# Patient Record
Sex: Male | Born: 1985
Health system: Southern US, Community
[De-identification: ages and names within clinical notes are randomized; demographics above are authoritative.]

---
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
## 2008-11-14 ENCOUNTER — Emergency Department (HOSPITAL_BASED_OUTPATIENT_CLINIC_OR_DEPARTMENT_OTHER): Admission: EM | Admit: 2008-11-14 | Discharge: 2008-11-14 | Payer: Self-pay | Admitting: Emergency Medicine

## 2008-11-14 ENCOUNTER — Ambulatory Visit: Payer: Self-pay | Admitting: Diagnostic Radiology

## 2009-05-14 ENCOUNTER — Emergency Department (HOSPITAL_BASED_OUTPATIENT_CLINIC_OR_DEPARTMENT_OTHER): Admission: EM | Admit: 2009-05-14 | Discharge: 2009-05-14 | Payer: Self-pay | Admitting: Emergency Medicine

## 2009-11-06 ENCOUNTER — Emergency Department (HOSPITAL_BASED_OUTPATIENT_CLINIC_OR_DEPARTMENT_OTHER): Admission: EM | Admit: 2009-11-06 | Discharge: 2009-11-06 | Payer: Self-pay | Admitting: Emergency Medicine

## 2010-01-02 ENCOUNTER — Emergency Department (HOSPITAL_BASED_OUTPATIENT_CLINIC_OR_DEPARTMENT_OTHER): Admission: EM | Admit: 2010-01-02 | Discharge: 2010-01-03 | Payer: Self-pay | Admitting: Emergency Medicine

## 2010-01-09 ENCOUNTER — Emergency Department (HOSPITAL_BASED_OUTPATIENT_CLINIC_OR_DEPARTMENT_OTHER): Admission: EM | Admit: 2010-01-09 | Discharge: 2009-11-04 | Payer: Self-pay | Admitting: Emergency Medicine

## 2010-04-02 ENCOUNTER — Emergency Department (HOSPITAL_BASED_OUTPATIENT_CLINIC_OR_DEPARTMENT_OTHER)
Admission: EM | Admit: 2010-04-02 | Discharge: 2010-04-02 | Disposition: A | Discharge status: Home / Self Care | Payer: BC Managed Care – PPO | Attending: Emergency Medicine | Admitting: Emergency Medicine

## 2010-04-02 DIAGNOSIS — R51 Headache: Secondary | ICD-10-CM | POA: Insufficient documentation

## 2010-04-17 LAB — URINALYSIS, ROUTINE W REFLEX MICROSCOPIC
Glucose, UA: NEGATIVE mg/dL
Nitrite: NEGATIVE
Protein, ur: NEGATIVE mg/dL
Specific Gravity, Urine: 1.031 — ABNORMAL HIGH (ref 1.005–1.030)

## 2010-05-08 LAB — URINALYSIS, ROUTINE W REFLEX MICROSCOPIC
Nitrite: NEGATIVE
Specific Gravity, Urine: 1.037 — ABNORMAL HIGH (ref 1.005–1.030)
Urobilinogen, UA: 1 mg/dL (ref 0.0–1.0)

## 2010-05-08 LAB — GC/CHLAMYDIA PROBE AMP, GENITAL
Chlamydia, DNA Probe: NEGATIVE
GC Probe Amp, Genital: NEGATIVE

## 2010-05-08 LAB — URINE MICROSCOPIC-ADD ON

## 2010-05-18 ENCOUNTER — Emergency Department (HOSPITAL_COMMUNITY)
Admission: EM | Admit: 2010-05-18 | Discharge: 2010-05-18 | Disposition: A | Discharge status: Home / Self Care | Payer: BC Managed Care – PPO | Attending: Emergency Medicine | Admitting: Emergency Medicine

## 2010-05-18 DIAGNOSIS — K089 Disorder of teeth and supporting structures, unspecified: Secondary | ICD-10-CM | POA: Insufficient documentation

## 2010-05-18 DIAGNOSIS — K047 Periapical abscess without sinus: Secondary | ICD-10-CM | POA: Insufficient documentation

## 2010-09-11 ENCOUNTER — Emergency Department (HOSPITAL_BASED_OUTPATIENT_CLINIC_OR_DEPARTMENT_OTHER)
Admission: EM | Admit: 2010-09-11 | Discharge: 2010-09-11 | Disposition: A | Discharge status: Home / Self Care | Payer: BC Managed Care – PPO | Attending: Emergency Medicine | Admitting: Emergency Medicine

## 2010-09-11 ENCOUNTER — Emergency Department (INDEPENDENT_AMBULATORY_CARE_PROVIDER_SITE_OTHER): Payer: BC Managed Care – PPO

## 2010-09-11 DIAGNOSIS — X838XXA Intentional self-harm by other specified means, initial encounter: Secondary | ICD-10-CM | POA: Insufficient documentation

## 2010-09-11 DIAGNOSIS — X58XXXA Exposure to other specified factors, initial encounter: Secondary | ICD-10-CM

## 2010-09-11 DIAGNOSIS — S60229A Contusion of unspecified hand, initial encounter: Secondary | ICD-10-CM

## 2010-09-11 DIAGNOSIS — M79609 Pain in unspecified limb: Secondary | ICD-10-CM

## 2010-09-11 NOTE — ED Notes (Signed)
Punched a wall monday-pain to right hand

## 2010-09-11 NOTE — ED Provider Notes (Signed)
History     CSN: [id number] Arrival date & time: 09/11/2010  8:22 PM  Chief Complaint  Patient presents with  . Hand Injury   HPI Comments: Pt states that he punched something  Patient is a 25 y.o. male presenting with hand injury. The history is provided by the patient. No language interpreter was used.  Hand Injury  The incident occurred more than 2 days ago. The incident occurred at home. The injury mechanism was a direct blow. The pain is present in the right hand. The quality of the pain is described as aching. The pain is at a severity of 6/10. The pain is moderate. The pain has been constant since the incident. He reports no foreign bodies present. The symptoms are aggravated by movement and palpation. He has tried nothing for the symptoms.    History reviewed. No pertinent past medical history.  History reviewed. No pertinent past surgical history.  No family history on file.  History  Substance Use Topics  . Smoking status: Never Smoker   . Smokeless tobacco: Not on file  . Alcohol Use: Yes      Review of Systems  Constitutional: Negative.   Respiratory: Negative.   Cardiovascular: Negative.   Neurological: Negative.     Physical Exam  BP 134/67  Pulse 80  Temp(Src) 98.1 F (36.7 C) (Oral)  Resp 20  Ht 6\' 2"  (1.88 m)  Wt 222 lb (100.699 kg)  BMI 28.50 kg/m2  SpO2 99%  Physical Exam  Nursing note and vitals reviewed. Constitutional: He is oriented to person, place, and time. He appears well-developed and well-nourished.  Cardiovascular: Normal rate and regular rhythm.   Pulmonary/Chest: Effort normal.  Musculoskeletal: Normal range of motion. He exhibits tenderness.  Neurological: He is alert and oriented to person, place, and time.       No obvious swelling noted to the right hand:pt tender to palpation on the 4th and 5th metacarpal  Skin: Skin is warm and dry.    ED Course  Procedures Results for orders placed during the hospital encounter of  01/09/10  URINALYSIS, ROUTINE W REFLEX MICROSCOPIC      Component Value Range   Color, Urine YELLOW  YELLOW    Appearance CLEAR  CLEAR    Specific Gravity, Urine 1.031 (*) 1.005 - 1.030    pH 6.0  5.0 - 8.0    Glucose, UA NEGATIVE  NEGATIVE (mg/dL)   Hgb urine dipstick NEGATIVE  NEGATIVE    Bilirubin Urine NEGATIVE  NEGATIVE    Ketones, ur NEGATIVE  NEGATIVE (mg/dL)   Protein, ur NEGATIVE  NEGATIVE (mg/dL)   Urobilinogen, UA 1.0  0.0 - 1.0 (mg/dL)   Nitrite NEGATIVE  NEGATIVE    Leukocytes, UA    NEGATIVE    Value: NEGATIVE MICROSCOPIC NOT DONE ON URINES WITH NEGATIVE PROTEIN, BLOOD, LEUKOCYTES, NITRITE, OR GLUCOSE <1000 mg/dL.   Dg Hand Complete Right  09/11/2010  *RADIOLOGY REPORT*  Clinical Data: Injured right hand.  RIGHT HAND - COMPLETE 3+ VIEW  Comparison: None  Findings: The joint spaces are maintained.  No acute fracture.  IMPRESSION: No acute bony findings.  Original Report Authenticated By: P. Loralie Champagne, M.D.    MDM No acute finding noted on x-ray      Teressa Lower, NP 09/11/10 2132

## 2010-09-12 NOTE — ED Provider Notes (Signed)
Medical screening examination/treatment/procedure(s) were performed by non-physician practitioner and as supervising physician I was immediately available for consultation/collaboration.   Hanley Seamen, MD 09/12/10 [phone]

## 2010-09-12 NOTE — ED Provider Notes (Deleted)
History     CSN: [id number] Arrival date & time: 09/11/2010  8:22 PM  Chief Complaint  Patient presents with  . Hand Injury   HPI  History reviewed. No pertinent past medical history.  History reviewed. No pertinent past surgical history.  No family history on file.  History  Substance Use Topics  . Smoking status: Never Smoker   . Smokeless tobacco: Not on file  . Alcohol Use: Yes      Review of Systems  Physical Exam  BP 134/67  Pulse 80  Temp(Src) 98.1 F (36.7 C) (Oral)  Resp 20  Ht 6\' 2"  (1.88 m)  Wt 222 lb (100.699 kg)  BMI 28.50 kg/m2  SpO2 99%  Physical Exam  ED Course  Procedures  MDM       Hanley Seamen, MD 09/12/10 0640  Hanley Seamen, MD 09/12/10 [phone]

## 2010-12-30 ENCOUNTER — Emergency Department (INDEPENDENT_AMBULATORY_CARE_PROVIDER_SITE_OTHER): Payer: Self-pay

## 2010-12-30 ENCOUNTER — Encounter (HOSPITAL_BASED_OUTPATIENT_CLINIC_OR_DEPARTMENT_OTHER): Payer: Self-pay | Admitting: *Deleted

## 2010-12-30 ENCOUNTER — Emergency Department (HOSPITAL_BASED_OUTPATIENT_CLINIC_OR_DEPARTMENT_OTHER)
Admission: EM | Admit: 2010-12-30 | Discharge: 2010-12-30 | Disposition: A | Discharge status: Home / Self Care | Payer: Self-pay | Attending: Emergency Medicine | Admitting: Emergency Medicine

## 2010-12-30 DIAGNOSIS — R509 Fever, unspecified: Secondary | ICD-10-CM

## 2010-12-30 DIAGNOSIS — J3489 Other specified disorders of nose and nasal sinuses: Secondary | ICD-10-CM | POA: Insufficient documentation

## 2010-12-30 DIAGNOSIS — R05 Cough: Secondary | ICD-10-CM

## 2010-12-30 DIAGNOSIS — J40 Bronchitis, not specified as acute or chronic: Secondary | ICD-10-CM | POA: Insufficient documentation

## 2010-12-30 MED ORDER — AZITHROMYCIN 250 MG PO TABS: 250.0000 mg | ORAL_TABLET | Freq: Every day | ORAL | Status: AC

## 2010-12-30 NOTE — ED Notes (Signed)
Pt amb to triage with quick steady gait. Pt reports cough and congestion x 2 days with one episode of emesis today.

## 2010-12-30 NOTE — ED Provider Notes (Signed)
History     CSN: [id number] Arrival date & time: 12/30/2010  5:10 PM   First MD Initiated Contact with Patient 12/30/10 1735      Chief Complaint  Patient presents with  . Cough  . Nasal Congestion    (Consider location/radiation/quality/duration/timing/severity/associated sxs/prior treatment) Patient is a 25 y.o. male presenting with cough. The history is provided by the patient. No language interpreter was used.  Cough This is a new problem. The current episode started 2 days ago. The problem occurs constantly. The problem has not changed since onset.The cough is productive of sputum. There has been no fever. Pertinent negatives include no headaches. He has tried decongestants and an opioid for the symptoms. The treatment provided no relief. He is not a smoker.    History reviewed. No pertinent past medical history.  History reviewed. No pertinent past surgical history.  History reviewed. No pertinent family history.  History  Substance Use Topics  . Smoking status: Never Smoker   . Smokeless tobacco: Not on file  . Alcohol Use: Yes      Review of Systems  Respiratory: Positive for cough.   Neurological: Negative for headaches.  All other systems reviewed and are negative.    Allergies  Review of patient's allergies indicates no known allergies.  Home Medications   Current Outpatient Rx  Name Route Sig Dispense Refill  . AMOXICILLIN PO Oral Take 1 capsule by mouth daily.      . GUAIFENESIN 600 MG PO TB12 Oral Take 600 mg by mouth 2 (two) times daily.      . DAYQUIL/NYQUIL COLD/FLU RELIEF PO Oral Take 2 capsules by mouth every 6 (six) hours as needed. Congestion      . TRAMADOL HCL 50 MG PO TABS Oral Take 50 mg by mouth every 6 (six) hours as needed. Maximum dose= 8 tablets per day       BP 113/62  Pulse 65  Temp(Src) 97.6 F (36.4 C) (Oral)  Resp 18  SpO2 100%  Physical Exam  Nursing note and vitals reviewed. Constitutional: He is oriented to  person, place, and time. He appears well-developed and well-nourished.  HENT:  Head: Normocephalic and atraumatic.  Right Ear: External ear normal.  Left Ear: External ear normal.  Mouth/Throat: Posterior oropharyngeal erythema present.  Cardiovascular: Normal rate and regular rhythm.   Pulmonary/Chest: Effort normal and breath sounds normal.  Musculoskeletal: Normal range of motion.  Neurological: He is alert and oriented to person, place, and time.  Skin: Skin is warm and dry.  Psychiatric: He has a normal mood and affect.    ED Course  Procedures (including critical care time)  Labs Reviewed - No data to display Dg Chest 2 View  12/30/2010  *RADIOLOGY REPORT*  Clinical Data: Cough and fever.  CHEST - 2 VIEW  Comparison: None  Findings: The cardiac silhouette, mediastinal and hilar contours are within normal limits.  Minimal streaky areas of atelectasis but no infiltrates or effusions.  The bony thorax is intact.  IMPRESSION: Streaky bibasilar atelectasis but no definite infiltrates.  Original Report Authenticated By: P. Loralie Champagne, M.D.     1. Bronchitis       MDM  Pt in no acute distress        Teressa Lower, NP 12/30/10 1834

## 2010-12-31 NOTE — ED Provider Notes (Signed)
Medical screening examination/treatment/procedure(s) were performed by non-physician practitioner and as supervising physician I was immediately available for consultation/collaboration.   Glynn Octave, MD 12/31/10 [phone]

## 2011-01-01 ENCOUNTER — Encounter (HOSPITAL_BASED_OUTPATIENT_CLINIC_OR_DEPARTMENT_OTHER): Payer: Self-pay

## 2011-01-01 ENCOUNTER — Emergency Department (HOSPITAL_BASED_OUTPATIENT_CLINIC_OR_DEPARTMENT_OTHER)
Admission: EM | Admit: 2011-01-01 | Discharge: 2011-01-01 | Disposition: A | Discharge status: Home / Self Care | Payer: Self-pay | Attending: Emergency Medicine | Admitting: Emergency Medicine

## 2011-01-01 DIAGNOSIS — B9789 Other viral agents as the cause of diseases classified elsewhere: Secondary | ICD-10-CM | POA: Insufficient documentation

## 2011-01-01 DIAGNOSIS — B349 Viral infection, unspecified: Secondary | ICD-10-CM

## 2011-01-01 DIAGNOSIS — R1110 Vomiting, unspecified: Secondary | ICD-10-CM | POA: Insufficient documentation

## 2011-01-01 MED ORDER — ONDANSETRON HCL 8 MG PO TABS: 8.0000 mg | ORAL_TABLET | Freq: Three times a day (TID) | ORAL | Status: AC | PRN

## 2011-01-01 MED ORDER — CETIRIZINE-PSEUDOEPHEDRINE ER 5-120 MG PO TB12: 1.0000 | ORAL_TABLET | Freq: Every day | ORAL | Status: AC

## 2011-01-01 MED ORDER — ONDANSETRON 8 MG PO TBDP
8.0000 mg | ORAL_TABLET | Freq: Once | ORAL | Status: AC
  Administered 2011-01-01: 8 mg via ORAL
  Filled 2011-01-01: qty 1

## 2011-01-01 NOTE — ED Provider Notes (Signed)
History     CSN: [id number] Arrival date & time: 01/01/2011  3:27 PM   First MD Initiated Contact with Patient 01/01/11 1545      Chief Complaint  Patient presents with  . Emesis    (Consider location/radiation/quality/duration/timing/severity/associated sxs/prior treatment) Patient is a 25 y.o. male presenting with vomiting.  Emesis  Pertinent negatives include no abdominal pain, no chills, no diarrhea, no fever and no headaches.  pt notes recent nasal congestion, post nasal drip, non prod cough, subj fever. Was seen in ed and dx w bronchitis. On zithromax and amox. States today had episode emesis, not bloody or bilious. Says looked like mucous. Has been having quit a lot of post nasal drainage, swallowing secretions. No headache. No sinus pain. No neck pain or stiffness. No sob. Denies any worsening of cough, says seems to be improving. No known ill contacts. No abd pain. No diarrhea. Normal appetite.   History reviewed. No pertinent past medical history.  History reviewed. No pertinent past surgical history.  No family history on file.  History  Substance Use Topics  . Smoking status: Never Smoker   . Smokeless tobacco: Not on file  . Alcohol Use: Yes      Review of Systems  Constitutional: Negative for fever and chills.  HENT: Negative for sore throat, neck pain and neck stiffness.   Eyes: Negative for discharge and redness.  Respiratory: Negative for shortness of breath.   Cardiovascular: Negative for chest pain and leg swelling.  Gastrointestinal: Positive for vomiting. Negative for abdominal pain and diarrhea.  Genitourinary: Negative for flank pain.  Musculoskeletal: Negative for back pain.  Skin: Negative for rash.  Neurological: Negative for headaches.  Hematological: Does not bruise/bleed easily.  Psychiatric/Behavioral: Negative for confusion.    Allergies  Review of patient's allergies indicates no known allergies.  Home Medications   Current  Outpatient Rx  Name Route Sig Dispense Refill  . ACETAMINOPHEN 500 MG PO TABS Oral Take 1,000 mg by mouth 3 (three) times daily as needed. For pain     . AMOXICILLIN PO Oral Take 1 capsule by mouth daily.      Marland Kitchen OVER THE COUNTER MEDICATION Oral Take 1 tablet by mouth 2 (two) times daily. mucinex cold formula     . AZITHROMYCIN 250 MG PO TABS Oral Take 1 tablet (250 mg total) by mouth daily. Take first 2 tablets together, then 1 every day until finished. 6 tablet 0    BP 116/72  Pulse 70  Temp(Src) 98.2 F (36.8 C) (Oral)  Resp 16  Ht 6\' 2"  (1.88 m)  Wt 215 lb (97.523 kg)  BMI 27.60 kg/m2  SpO2 98%  Physical Exam  Nursing note and vitals reviewed. Constitutional: He is oriented to person, place, and time. He appears well-developed and well-nourished. No distress.  HENT:  Head: Atraumatic.  Mouth/Throat: Oropharynx is clear and moist.       Nasal congestion. No sinus tenderness  Eyes: Pupils are equal, round, and reactive to light.  Neck: Neck supple. No tracheal deviation present.  Cardiovascular: Normal rate, regular rhythm, normal heart sounds and intact distal pulses.  Exam reveals no gallop and no friction rub.   No murmur heard. Pulmonary/Chest: Effort normal. No accessory muscle usage. No respiratory distress. He has no rales.  Abdominal: Soft. Bowel sounds are normal. He exhibits no distension and no mass. There is no tenderness.  Musculoskeletal: Normal range of motion. He exhibits no edema and no tenderness.  Neurological: He is alert  and oriented to person, place, and time.  Skin: Skin is warm and dry.  Psychiatric: He has a normal mood and affect.    ED Course  Procedures (including critical care time)  Labs Reviewed - No data to display Dg Chest 2 View  12/30/2010  *RADIOLOGY REPORT*  Clinical Data: Cough and fever.  CHEST - 2 VIEW  Comparison: None  Findings: The cardiac silhouette, mediastinal and hilar contours are within normal limits.  Minimal streaky areas  of atelectasis but no infiltrates or effusions.  The bony thorax is intact.  IMPRESSION: Streaky bibasilar atelectasis but no definite infiltrates.  Original Report Authenticated By: P. Loralie Champagne, M.D.     No diagnosis found.    MDM  cxr 2 days ago neg for pna. zofran po. Po fluids. Suspect viral illness.        Suzi Roots, MD 01/01/11 [phone]

## 2011-01-01 NOTE — ED Notes (Signed)
Reports dx with bronchitis 11/27-states he is now vomiting-went back to work today but states had to leave

## 2011-05-04 ENCOUNTER — Emergency Department (HOSPITAL_BASED_OUTPATIENT_CLINIC_OR_DEPARTMENT_OTHER)
Admission: EM | Admit: 2011-05-04 | Discharge: 2011-05-04 | Disposition: A | Discharge status: Home / Self Care | Payer: BC Managed Care – PPO | Attending: Emergency Medicine | Admitting: Emergency Medicine

## 2011-05-04 ENCOUNTER — Encounter (HOSPITAL_BASED_OUTPATIENT_CLINIC_OR_DEPARTMENT_OTHER): Payer: Self-pay | Admitting: *Deleted

## 2011-05-04 DIAGNOSIS — S025XXA Fracture of tooth (traumatic), initial encounter for closed fracture: Secondary | ICD-10-CM | POA: Insufficient documentation

## 2011-05-04 DIAGNOSIS — K0889 Other specified disorders of teeth and supporting structures: Secondary | ICD-10-CM

## 2011-05-04 DIAGNOSIS — X58XXXA Exposure to other specified factors, initial encounter: Secondary | ICD-10-CM | POA: Insufficient documentation

## 2011-05-04 MED ORDER — AMOXICILLIN 500 MG PO CAPS: 500.0000 mg | ORAL_CAPSULE | Freq: Three times a day (TID) | ORAL | Status: AC

## 2011-05-04 MED ORDER — ONDANSETRON 4 MG PO TBDP: 4.0000 mg | ORAL_TABLET | Freq: Three times a day (TID) | ORAL | Status: AC | PRN

## 2011-05-04 NOTE — ED Provider Notes (Signed)
History     CSN: [id number]  Arrival date & time 05/04/11  1246   First MD Initiated Contact with Patient 05/04/11 1329      Chief Complaint  Patient presents with  . Dental Pain     HPI Right lower molar broken gum and face swelling has dentist appt Friday but cant get in until then thinks it may be abcessed  History reviewed. No pertinent past medical history.  History reviewed. No pertinent past surgical history.  History reviewed. No pertinent family history.  History  Substance Use Topics  . Smoking status: Never Smoker   . Smokeless tobacco: Not on file  . Alcohol Use: Yes      Review of Systems  All other systems reviewed and are negative.    Allergies  Review of patient's allergies indicates no known allergies.  Home Medications   Current Outpatient Rx  Name Route Sig Dispense Refill  . ACETAMINOPHEN 500 MG PO TABS Oral Take 1,000 mg by mouth 3 (three) times daily as needed. For pain     . AMOXICILLIN 500 MG PO CAPS Oral Take 1 capsule (500 mg total) by mouth 3 (three) times daily. 21 capsule 0  . AMOXICILLIN PO Oral Take 1 capsule by mouth daily.      Marland Kitchen CETIRIZINE-PSEUDOEPHEDRINE ER 5-120 MG PO TB12 Oral Take 1 tablet by mouth daily. 20 tablet 0  . ONDANSETRON 4 MG PO TBDP Oral Take 1 tablet (4 mg total) by mouth every 8 (eight) hours as needed for nausea. 15 tablet 0  . OVER THE COUNTER MEDICATION Oral Take 1 tablet by mouth 2 (two) times daily. mucinex cold formula       BP 135/62  Pulse 93  Temp(Src) 98.6 F (37 C) (Oral)  Resp 18  Ht 6\' 2"  (1.88 m)  Wt 212 lb 9 oz (96.418 kg)  BMI 27.29 kg/m2  SpO2 99%  Physical Exam  Nursing note and vitals reviewed. Constitutional: He is oriented to person, place, and time. He appears well-developed and well-nourished. No distress.  HENT:  Head: Normocephalic and atraumatic.  Mouth/Throat:    Eyes: Pupils are equal, round, and reactive to light.  Neck: Normal range of motion.  Cardiovascular:  Normal rate and intact distal pulses.   Pulmonary/Chest: No respiratory distress.  Abdominal: Normal appearance. He exhibits no distension.  Musculoskeletal: Normal range of motion.  Neurological: He is alert and oriented to person, place, and time. No cranial nerve deficit.  Skin: Skin is warm and dry. No rash noted.  Psychiatric: He has a normal mood and affect. His behavior is normal.    ED Course  Procedures (including critical care time)  Labs Reviewed - No data to display No results found.   1. Pain, dental       MDM          Nelia Shi, MD 05/04/11 [phone]

## 2011-05-04 NOTE — ED Notes (Signed)
Right lower molar broken gum and face swelling has dentist appt Friday but cant get in until then thinks it may be abcessed

## 2011-05-04 NOTE — Discharge Instructions (Signed)
Dental Pain  A tooth ache may be caused by cavities (tooth decay). Cavities expose the nerve of the tooth to air and hot or cold temperatures. It may come from an infection or abscess (also called a boil or furuncle) around your tooth. It is also often caused by dental caries (tooth decay). This causes the pain you are having.  DIAGNOSIS   Your caregiver can diagnose this problem by exam.  TREATMENT   · If caused by an infection, it may be treated with medications which kill germs (antibiotics) and pain medications as prescribed by your caregiver. Take medications as directed.  · Only take over-the-counter or prescription medicines for pain, discomfort, or fever as directed by your caregiver.  · Whether the tooth ache today is caused by infection or dental disease, you should see your dentist as soon as possible for further care.  SEEK MEDICAL CARE IF:  The exam and treatment you received today has been provided on an emergency basis only. This is not a substitute for complete medical or dental care. If your problem worsens or new problems (symptoms) appear, and you are unable to meet with your dentist, call or return to this location.  SEEK IMMEDIATE MEDICAL CARE IF:   · You have a fever.  · You develop redness and swelling of your face, jaw, or neck.  · You are unable to open your mouth.  · You have severe pain uncontrolled by pain medicine.  MAKE SURE YOU:   · Understand these instructions.  · Will watch your condition.  · Will get help right away if you are not doing well or get worse.  Document Released: 01/19/2005 Document Revised: 01/08/2011 Document Reviewed: 09/07/2007  [employer]® Patient Information ©2012 [employer].

## 2011-05-31 ENCOUNTER — Emergency Department (HOSPITAL_BASED_OUTPATIENT_CLINIC_OR_DEPARTMENT_OTHER)
Admission: EM | Admit: 2011-05-31 | Discharge: 2011-05-31 | Disposition: A | Discharge status: Home / Self Care | Payer: BC Managed Care – PPO | Attending: Emergency Medicine | Admitting: Emergency Medicine

## 2011-05-31 DIAGNOSIS — K029 Dental caries, unspecified: Secondary | ICD-10-CM

## 2011-05-31 MED ORDER — TRAMADOL HCL 50 MG PO TABS: 50.0000 mg | ORAL_TABLET | Freq: Four times a day (QID) | ORAL | Status: AC | PRN

## 2011-05-31 MED ORDER — PENICILLIN V POTASSIUM 500 MG PO TABS: 500.0000 mg | ORAL_TABLET | Freq: Four times a day (QID) | ORAL | Status: AC

## 2011-05-31 NOTE — Discharge Instructions (Signed)
Dental Care and Dentist Visits   Dental care supports good overall health. Regular dental visits can also help you avoid dental pain, bleeding, infection, and other more serious health problems in the future. It is important to keep the mouth healthy because diseases in the teeth, gums, and other oral tissues can spread to other areas of the body. Some problems, such as diabetes, heart disease, and pre-term labor have been associated with poor oral health.   See your dentist every 6 months. If you experience emergency problems such as a toothache or broken tooth, go to the dentist right away. If you see your dentist regularly, you may catch problems early. It is easier to be treated for problems in the early stages.   WHAT TO EXPECT AT A DENTIST VISIT   Your dentist will look for many common oral health problems and recommend proper treatment. At your regular dental visit, you can expect:   Gentle cleaning of the teeth and gums. This includes scraping and polishing. This helps to remove the sticky substance around the teeth and gums (plaque). Plaque forms in the mouth shortly after eating. Over time, plaque hardens on the teeth as tartar. If tartar is not removed regularly, it can cause problems. Cleaning also helps remove stains.   Periodic X-rays. These pictures of the teeth and supporting bone will help your dentist assess the health of your teeth.   Periodic fluoride treatments. Fluoride is a natural mineral shown to help strengthen teeth. Fluoride treatment involves applying a fluoride gel or varnish to the teeth. It is most commonly done in children.   Examination of the mouth, tongue, jaws, teeth, and gums to look for any oral health problems, such as:   Cavities (dental caries). This is decay on the tooth caused by plaque, sugar, and acid in the mouth. It is best to catch a cavity when it is small.   Inflammation of the gums caused by plaque buildup (gingivitis).   Problems with the mouth or malformed or  misaligned teeth.   Oral cancer or other diseases of the soft tissues or jaws.   KEEP YOUR TEETH AND GUMS HEALTHY   For healthy teeth and gums, follow these general guidelines as well as your dentist's specific advice:   Have your teeth professionally cleaned at the dentist every 6 months.   Brush twice daily with a fluoride toothpaste.   Floss your teeth daily.   Ask your dentist if you need fluoride supplements, treatments, or fluoride toothpaste.   Eat a healthy diet. Reduce foods and drinks with added sugar.   Avoid smoking.  TREATMENT FOR ORAL HEALTH PROBLEMS   If you have oral health problems, treatment varies depending on the conditions present in your teeth and gums.   Your caregiver will most likely recommend good oral hygiene at each visit.   For cavities, gingivitis, or other oral health disease, your caregiver will perform a procedure to treat the problem. This is typically done at a separate appointment. Sometimes your caregiver will refer you to another dental specialist for specific tooth problems or for surgery.  SEEK IMMEDIATE DENTAL CARE IF:   You have pain, bleeding, or soreness in the gum, tooth, jaw, or mouth area.   A permanent tooth becomes loose or separated from the gum socket.   You experience a blow or injury to the mouth or jaw area.  Document Released: 10/01/2010 Document Revised: 01/08/2011 Document Reviewed: 10/01/2010   [employer] Patient Information 2012 [employer].

## 2011-05-31 NOTE — ED Provider Notes (Signed)
History     CSN: [id number]  Arrival date & time 05/31/11  0103   First MD Initiated Contact with Patient 05/31/11 0109      Chief Complaint  Patient presents with  . Dental Pain    (Consider location/radiation/quality/duration/timing/severity/associated sxs/prior treatment) Patient is a 26 y.o. male presenting with tooth pain. The history is provided by the patient. No language interpreter was used.  Dental PainPrimary symptoms do not include mouth pain, oral bleeding or oral lesions. The symptoms began more than 1 month ago. The symptoms are worsening. The symptoms are recurrent. The symptoms occur constantly.  Additional symptoms include: dental sensitivity to temperature. Additional symptoms do not include: purulent gums. Medical issues do not include: smoking.  Wants a second opinion does not want tooth removed.  No past medical history on file.  No past surgical history on file.  No family history on file.  History  Substance Use Topics  . Smoking status: Never Smoker   . Smokeless tobacco: Not on file  . Alcohol Use: Yes      Review of Systems  All other systems reviewed and are negative.    Allergies  Review of patient's allergies indicates no known allergies.  Home Medications   Current Outpatient Rx  Name Route Sig Dispense Refill  . ACETAMINOPHEN 500 MG PO TABS Oral Take 1,000 mg by mouth 3 (three) times daily as needed. For pain     . AMOXICILLIN PO Oral Take 1 capsule by mouth daily.      Marland Kitchen CETIRIZINE-PSEUDOEPHEDRINE ER 5-120 MG PO TB12 Oral Take 1 tablet by mouth daily. 20 tablet 0  . OVER THE COUNTER MEDICATION Oral Take 1 tablet by mouth 2 (two) times daily. mucinex cold formula     . PENICILLIN V POTASSIUM 500 MG PO TABS Oral Take 1 tablet (500 mg total) by mouth 4 (four) times daily. 28 tablet 0  . TRAMADOL HCL 50 MG PO TABS Oral Take 1 tablet (50 mg total) by mouth every 6 (six) hours as needed for pain. 15 tablet 0    BP 128/79  Pulse 58   Temp(Src) 98.2 F (36.8 C) (Oral)  Resp 18  SpO2 100%  Physical Exam  Constitutional: He appears well-developed and well-nourished. No distress.  HENT:  Head: Normocephalic and atraumatic.  Mouth/Throat: Oropharynx is clear and moist.    Eyes: EOM are normal.  Neck: Normal range of motion. Neck supple.  Cardiovascular: Normal rate and regular rhythm.   Pulmonary/Chest: Effort normal and breath sounds normal.  Neurological: He is alert. He has normal reflexes.  Psychiatric: He has a normal mood and affect.    ED Course  Procedures (including critical care time)  Labs Reviewed - No data to display No results found.   1. Dental caries extending into pulp       MDM  Follow up with a dentist take all antibiotics        Syna Gad K Foster Sonnier-Rasch, MD 05/31/11 [phone]

## 2011-05-31 NOTE — ED Notes (Signed)
Pt states that he was seen by his dentist and told that his tooth needed extraction states that while waiting for a second opinion he began to have pain and swelling again

## 2012-04-15 ENCOUNTER — Encounter (HOSPITAL_BASED_OUTPATIENT_CLINIC_OR_DEPARTMENT_OTHER): Payer: Self-pay

## 2012-04-15 ENCOUNTER — Emergency Department (HOSPITAL_BASED_OUTPATIENT_CLINIC_OR_DEPARTMENT_OTHER)
Admission: EM | Admit: 2012-04-15 | Discharge: 2012-04-15 | Disposition: A | Discharge status: Home / Self Care | Payer: BC Managed Care – PPO | Attending: Emergency Medicine | Admitting: Emergency Medicine

## 2012-04-15 DIAGNOSIS — K5289 Other specified noninfective gastroenteritis and colitis: Secondary | ICD-10-CM | POA: Insufficient documentation

## 2012-04-15 DIAGNOSIS — R197 Diarrhea, unspecified: Secondary | ICD-10-CM | POA: Insufficient documentation

## 2012-04-15 DIAGNOSIS — R109 Unspecified abdominal pain: Secondary | ICD-10-CM | POA: Insufficient documentation

## 2012-04-15 MED ORDER — ONDANSETRON HCL 8 MG PO TABS: 8.0000 mg | ORAL_TABLET | Freq: Three times a day (TID) | ORAL | Status: DC | PRN

## 2012-04-15 MED ORDER — SODIUM CHLORIDE 0.9 % IV BOLUS (SEPSIS)
1000.0000 mL | Freq: Once | INTRAVENOUS | Status: AC
  Administered 2012-04-15: 1000 mL via INTRAVENOUS

## 2012-04-15 MED ORDER — ONDANSETRON HCL 4 MG/2ML IJ SOLN
4.0000 mg | Freq: Once | INTRAMUSCULAR | Status: AC
  Administered 2012-04-15: 4 mg via INTRAVENOUS
  Filled 2012-04-15: qty 2

## 2012-04-15 MED ORDER — KETOROLAC TROMETHAMINE 30 MG/ML IJ SOLN
30.0000 mg | Freq: Once | INTRAMUSCULAR | Status: AC
  Administered 2012-04-15: 30 mg via INTRAVENOUS
  Filled 2012-04-15: qty 1

## 2012-04-15 NOTE — ED Notes (Signed)
Pt states that he is feeling better and is ready for discharge, MD notified.

## 2012-04-15 NOTE — ED Notes (Signed)
Pt reports nausea, vomiting, diarrhea, and abdominal pain that started last night.

## 2012-04-15 NOTE — ED Provider Notes (Signed)
History     CSN: [id number]  Arrival date & time 04/15/12  1012   First MD Initiated Contact with Patient 04/15/12 1032      Chief Complaint  Patient presents with  . Emesis  . Diarrhea  . Abdominal Pain    (Consider location/radiation/quality/duration/timing/severity/associated sxs/prior treatment) HPI Comments: Patient presents for eval of n/v/d for the past 12 hours.  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  All non-bloody, non-bilious.  No fever or chills.  Reports generalized cramping but no pain.  Patient is a 27 y.o. male presenting with vomiting. The history is provided by the patient.  Emesis Severity:  Moderate Duration:  12 hours Timing:  Constant Quality:  Stomach contents Able to tolerate:  Liquids Progression:  Worsening Chronicity:  New Recent urination:  Normal Relieved by:  Nothing Worsened by:  Nothing tried   History reviewed. No pertinent past medical history.  History reviewed. No pertinent past surgical history.  No family history on file.  History  Substance Use Topics  . Smoking status: Never Smoker   . Smokeless tobacco: Not on file  . Alcohol Use: Yes     Comment: weekends      Review of Systems  Gastrointestinal: Positive for vomiting.  All other systems reviewed and are negative.    Allergies  Review of patient's allergies indicates no known allergies.  Home Medications  No current outpatient prescriptions on file.  BP 128/72  Pulse 82  Temp(Src) 98.4 F (36.9 C) (Oral)  Resp 20  SpO2 100%  Physical Exam  Nursing note and vitals reviewed. Constitutional: He is oriented to person, place, and time. He appears well-developed and well-nourished. No distress.  HENT:  Head: Normocephalic and atraumatic.  Mouth/Throat: Oropharynx is clear and moist.  Neck: Normal range of motion. Neck supple.  Cardiovascular: Normal rate and regular rhythm.   No murmur  heard. Pulmonary/Chest: Effort normal and breath sounds normal. No respiratory distress. He has no wheezes.  Abdominal: Soft. Bowel sounds are normal. He exhibits no distension. There is no tenderness.  Musculoskeletal: Normal range of motion. He exhibits no edema.  Lymphadenopathy:    He has no cervical adenopathy.  Neurological: He is alert and oriented to person, place, and time.  Skin: Skin is warm and dry. He is not diaphoretic.    ED Course  Procedures (including critical care time)  Labs Reviewed - No data to display No results found.   No diagnosis found.    MDM  Symptoms likely viral in nature.  Feels better with meds given.  Will discharge with zofran, return prn.        Geoffery Lyons, MD 04/15/12 1135

## 2012-07-23 IMAGING — CR DG HAND COMPLETE 3+V*R*
3 series · 3 of 3 positions shown · non-contrast
Comparison: None

CLINICAL DATA: Injured right hand.

RIGHT HAND - COMPLETE 3+ VIEW

[x hand pa right]
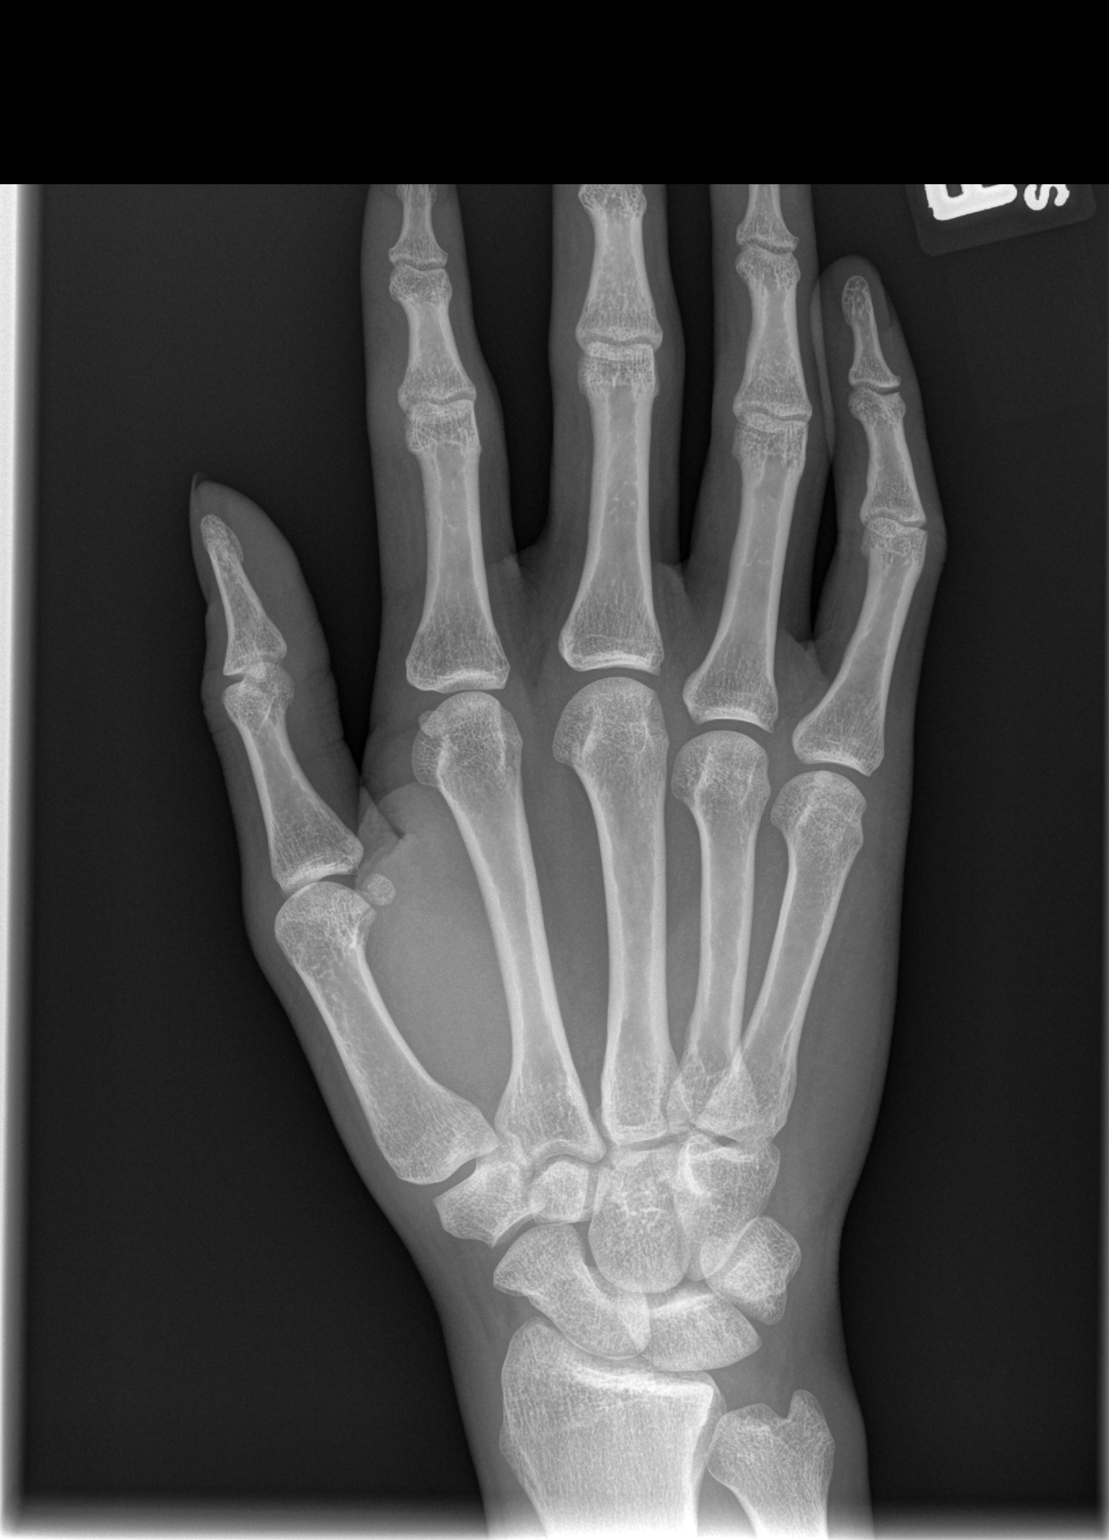

[x hand oblique right]
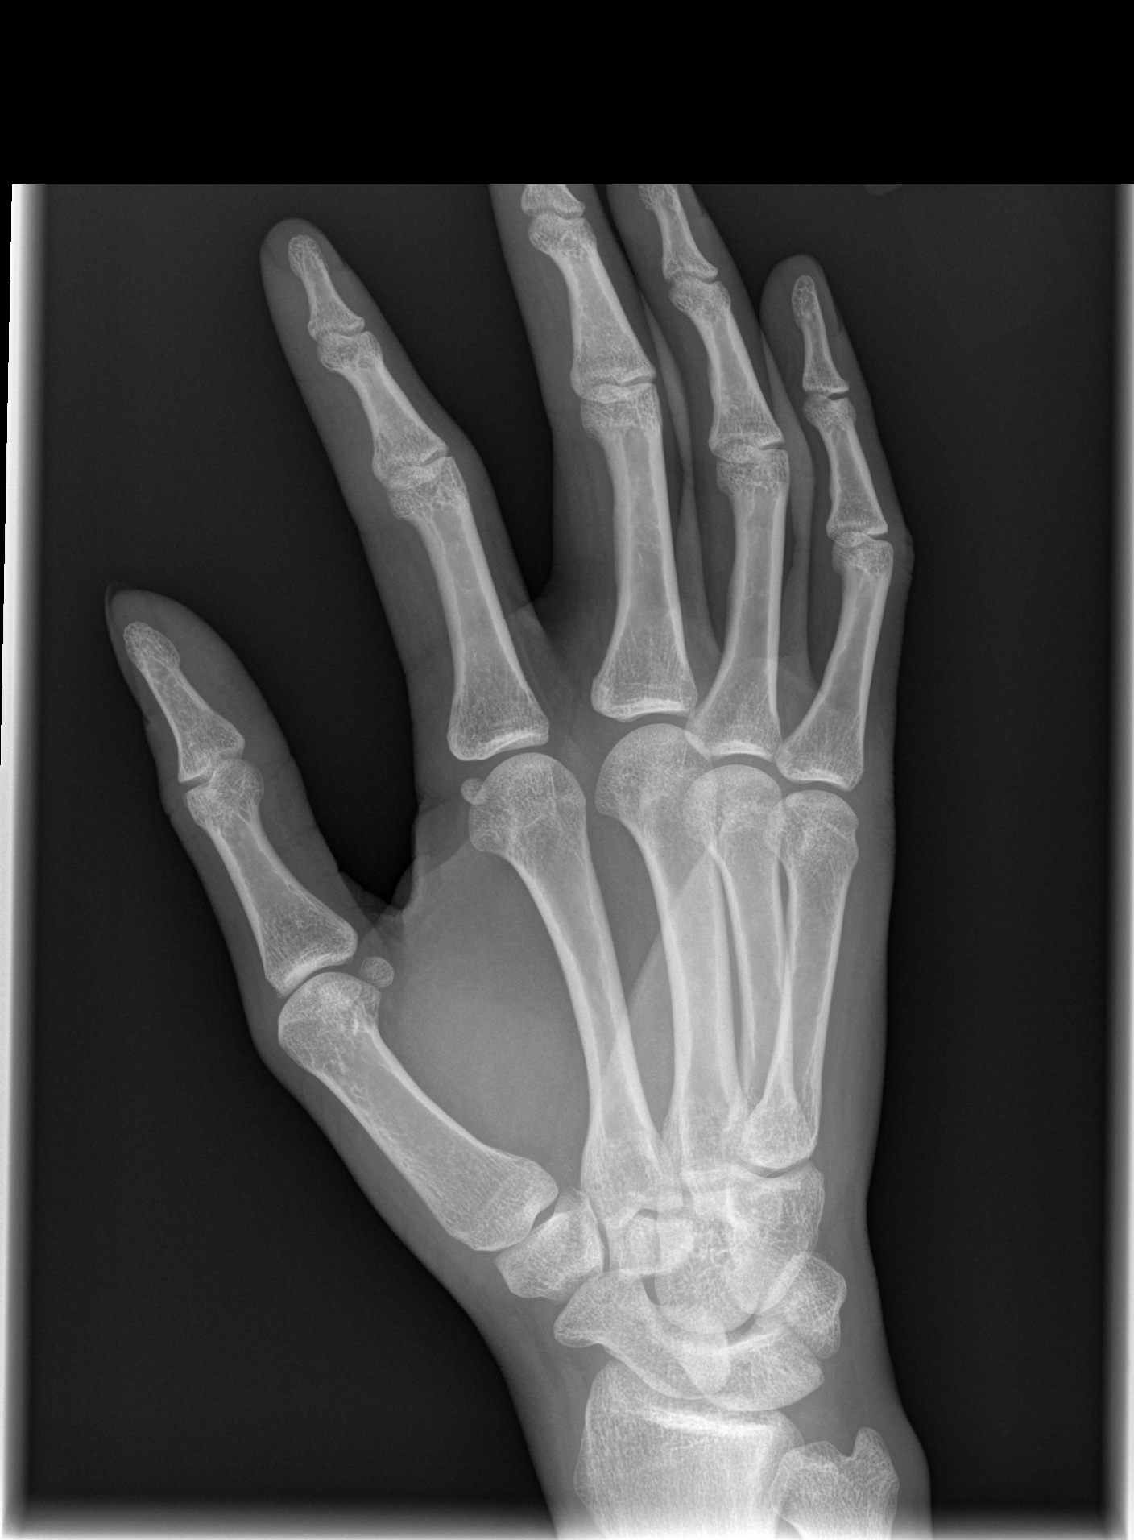

[x hand lat right]
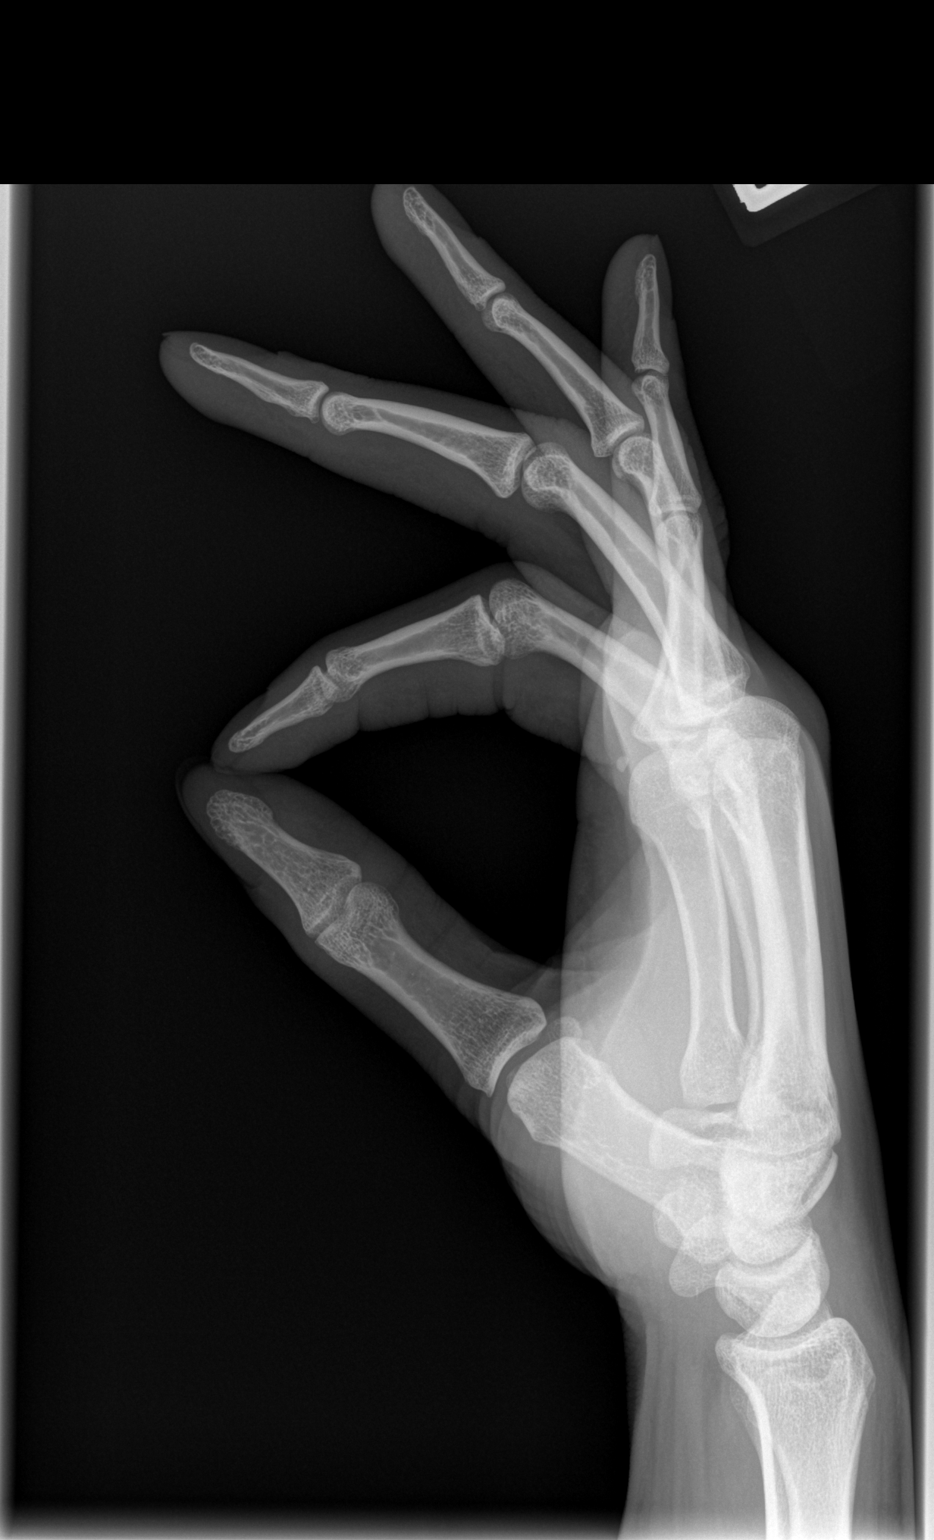

[3 of 3 positions shown; findings below may reference images not displayed]

FINDINGS: The joint spaces are maintained.  No acute fracture.
IMPRESSION: No acute bony findings.

## 2012-08-06 ENCOUNTER — Encounter (HOSPITAL_BASED_OUTPATIENT_CLINIC_OR_DEPARTMENT_OTHER): Payer: Self-pay | Admitting: *Deleted

## 2012-08-06 ENCOUNTER — Emergency Department (HOSPITAL_BASED_OUTPATIENT_CLINIC_OR_DEPARTMENT_OTHER)
Admission: EM | Admit: 2012-08-06 | Discharge: 2012-08-06 | Disposition: A | Discharge status: Home / Self Care | Payer: Self-pay | Attending: Emergency Medicine | Admitting: Emergency Medicine

## 2012-08-06 DIAGNOSIS — J029 Acute pharyngitis, unspecified: Secondary | ICD-10-CM | POA: Insufficient documentation

## 2012-08-06 DIAGNOSIS — R1310 Dysphagia, unspecified: Secondary | ICD-10-CM | POA: Insufficient documentation

## 2012-08-06 DIAGNOSIS — R5381 Other malaise: Secondary | ICD-10-CM | POA: Insufficient documentation

## 2012-08-06 DIAGNOSIS — R5383 Other fatigue: Secondary | ICD-10-CM | POA: Insufficient documentation

## 2012-08-06 DIAGNOSIS — R509 Fever, unspecified: Secondary | ICD-10-CM | POA: Insufficient documentation

## 2012-08-06 DIAGNOSIS — R630 Anorexia: Secondary | ICD-10-CM | POA: Insufficient documentation

## 2012-08-06 LAB — RAPID STREP SCREEN (MED CTR MEBANE ONLY): Streptococcus, Group A Screen (Direct): NEGATIVE

## 2012-08-06 MED ORDER — AMOXICILLIN 500 MG PO CAPS: 500.0000 mg | ORAL_CAPSULE | Freq: Three times a day (TID) | ORAL | Status: DC

## 2012-08-06 MED ORDER — AMOXICILLIN 500 MG PO CAPS
500.0000 mg | ORAL_CAPSULE | Freq: Once | ORAL | Status: AC
  Administered 2012-08-06: 500 mg via ORAL
  Filled 2012-08-06: qty 1

## 2012-08-06 MED ORDER — DEXAMETHASONE SODIUM PHOSPHATE 10 MG/ML IJ SOLN
10.0000 mg | Freq: Once | INTRAMUSCULAR | Status: AC
  Administered 2012-08-06: 10 mg via INTRAVENOUS
  Filled 2012-08-06: qty 1

## 2012-08-06 MED ORDER — HYDROCODONE-ACETAMINOPHEN 7.5-325 MG/15ML PO SOLN: 15.0000 mL | Freq: Four times a day (QID) | ORAL | Status: DC | PRN

## 2012-08-06 MED ORDER — KETOROLAC TROMETHAMINE 30 MG/ML IJ SOLN
30.0000 mg | Freq: Once | INTRAMUSCULAR | Status: AC
  Administered 2012-08-06: 30 mg via INTRAVENOUS
  Filled 2012-08-06: qty 1

## 2012-08-06 MED ORDER — SODIUM CHLORIDE 0.9 % IV BOLUS (SEPSIS)
1000.0000 mL | Freq: Once | INTRAVENOUS | Status: AC
  Administered 2012-08-06: 1000 mL via INTRAVENOUS

## 2012-08-06 NOTE — ED Provider Notes (Addendum)
   History    CSN: [id number] Arrival date & time 08/06/12  1304  First MD Initiated Contact with Patient 08/06/12 1322     Chief Complaint  Patient presents with  . Sore Throat   (Consider location/radiation/quality/duration/timing/severity/associated sxs/prior Treatment) Patient is a 27 y.o. male presenting with pharyngitis. The history is provided by the patient.  Sore Throat This is a new problem. Episode onset: 3 days. The problem occurs constantly. The problem has been gradually improving. Associated symptoms comments: Sore throat, fatigue, pain with swallowing, decreased appetite, fever. The symptoms are aggravated by swallowing. Nothing relieves the symptoms. He has tried nothing for the symptoms. The treatment provided no relief.   History reviewed. No pertinent past medical history. History reviewed. No pertinent past surgical history. History reviewed. No pertinent family history. History  Substance Use Topics  . Smoking status: Never Smoker   . Smokeless tobacco: Not on file  . Alcohol Use: Yes     Comment: weekends    Review of Systems  HENT: Positive for voice change.   All other systems reviewed and are negative.    Allergies  Review of patient's allergies indicates no known allergies.  Home Medications   Current Outpatient Rx  Name  Route  Sig  Dispense  Refill  . ondansetron (ZOFRAN) 8 MG tablet   Oral   Take 1 tablet (8 mg total) by mouth every 8 (eight) hours as needed for nausea.   4 tablet   0    BP 132/64  Pulse 96  Temp(Src) 98.7 F (37.1 C) (Oral)  Resp 16  Ht 6\' 2"  (1.88 m)  Wt 213 lb (96.616 kg)  BMI 27.34 kg/m2  SpO2 100% Physical Exam  Nursing note and vitals reviewed. Constitutional: He is oriented to person, place, and time. He appears well-developed and well-nourished. No distress.  HENT:  Head: Normocephalic and atraumatic. No trismus in the jaw.  Mouth/Throat: Oropharyngeal exudate, posterior oropharyngeal edema and posterior  oropharyngeal erythema present. No tonsillar abscesses.  Eyes: Conjunctivae and EOM are normal. Pupils are equal, round, and reactive to light.  Neck: Normal range of motion. Neck supple.  Cardiovascular: Normal rate, regular rhythm and intact distal pulses.   No murmur heard. Pulmonary/Chest: Effort normal and breath sounds normal. No respiratory distress. He has no wheezes. He has no rales.  Abdominal: Soft. He exhibits no distension. There is no tenderness. There is no rebound and no guarding.  Musculoskeletal: Normal range of motion. He exhibits no edema and no tenderness.  Lymphadenopathy:    He has cervical adenopathy.  Neurological: He is alert and oriented to person, place, and time.  Skin: Skin is warm and dry. No rash noted. No erythema.  Psychiatric: He has a normal mood and affect. His behavior is normal.    ED Course  Procedures (including critical care time) Labs Reviewed  RAPID STREP SCREEN   No results found. 1. Pharyngitis     MDM   Patient with signs and symptoms consistent with strep pharyngitis with tonsillar exudates, swelling and erythema. Fever and dehydration on exam. Patient had a rapid strep done prior to me seeing the patient which was negative however her exam is suggestive of strep. Throat culture was sent. No sign of peritonsillar abscess, retropharyngeal abscess or epiglottitis.    Patient given IV fluids, Decadron pain control. Sent home with amoxicillin.  Gwyneth Sprout, MD 08/06/12 1346  Gwyneth Sprout, MD 08/06/12 1454

## 2012-08-06 NOTE — ED Notes (Signed)
Sore throat, decreased appetite since Wed. Tired. Throat reddened.Voice muffled.

## 2012-08-09 LAB — CULTURE, GROUP A STREP

## 2012-11-10 IMAGING — CR DG CHEST 2V
2 series · 2 of 2 positions shown · non-contrast
Comparison: None

CLINICAL DATA: Cough and fever.

CHEST - 2 VIEW

[w chest pa]
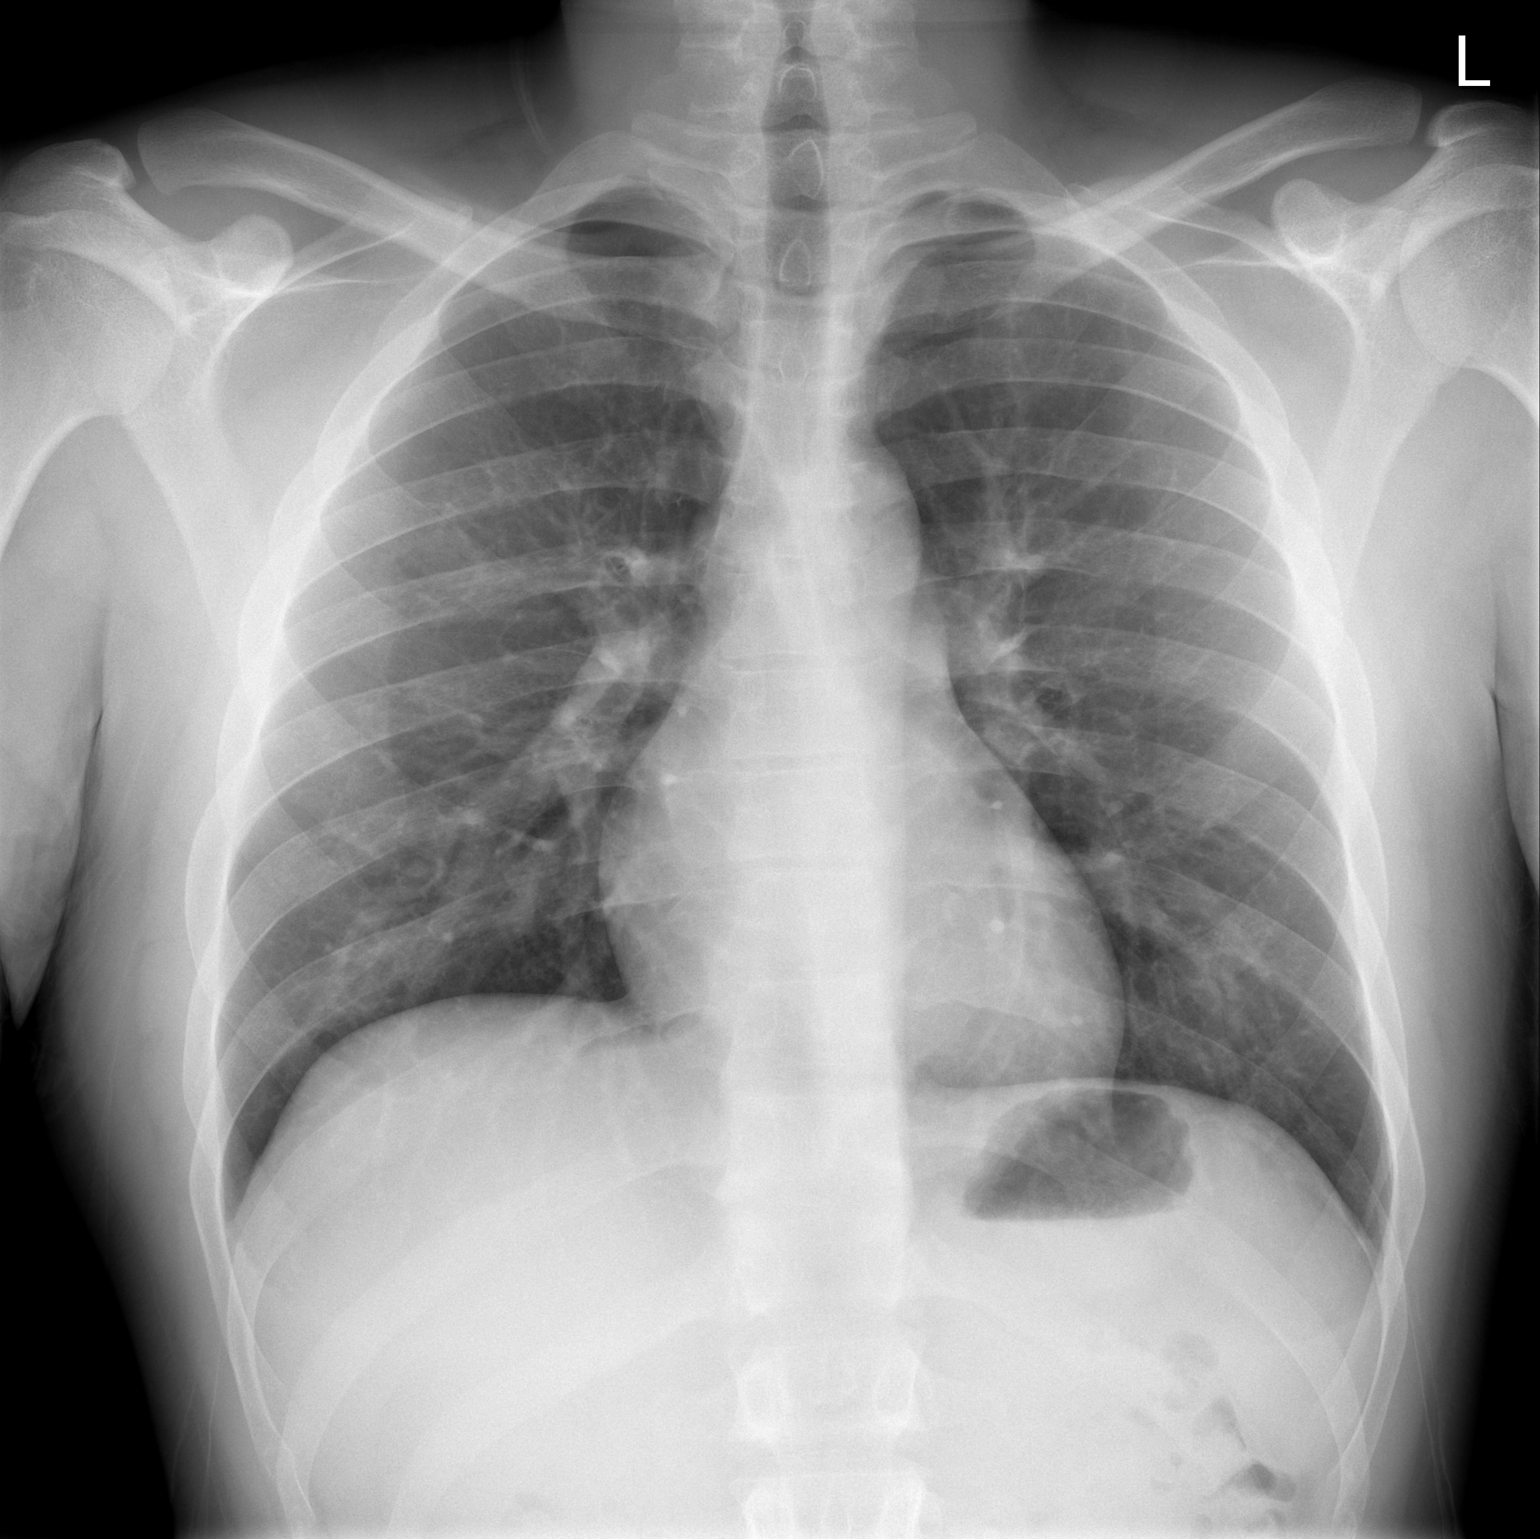

[w chest lat]
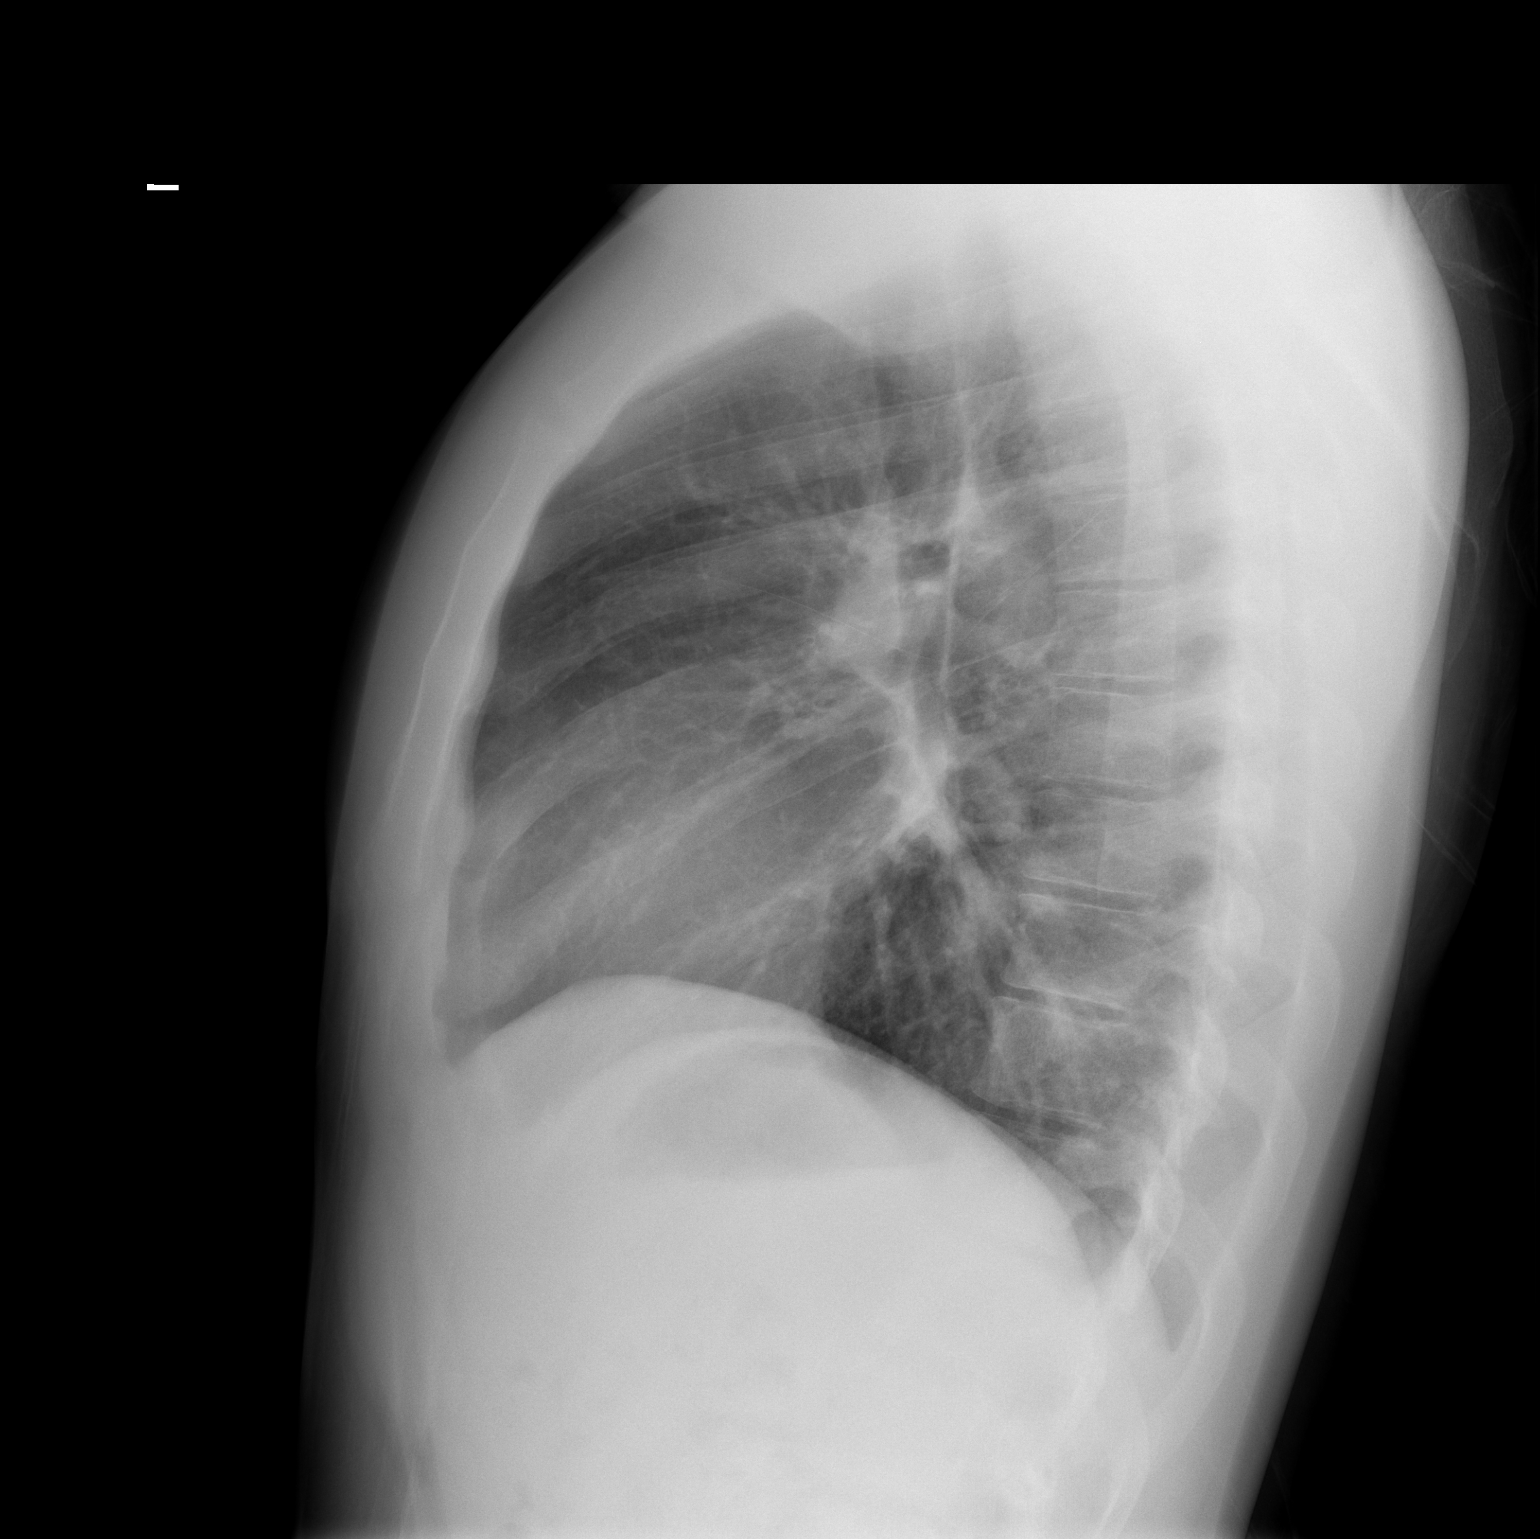

[2 of 2 positions shown; findings below may reference images not displayed]

FINDINGS: The cardiac silhouette, mediastinal and hilar contours
are within normal limits.  Minimal streaky areas of atelectasis but
no infiltrates or effusions.  The bony thorax is intact.
IMPRESSION: Streaky bibasilar atelectasis but no definite infiltrates.

## 2013-03-01 ENCOUNTER — Emergency Department (HOSPITAL_BASED_OUTPATIENT_CLINIC_OR_DEPARTMENT_OTHER)
Admission: EM | Admit: 2013-03-01 | Discharge: 2013-03-01 | Disposition: A | Discharge status: Home / Self Care | Payer: BC Managed Care – PPO | Attending: Emergency Medicine | Admitting: Emergency Medicine

## 2013-03-01 ENCOUNTER — Encounter (HOSPITAL_BASED_OUTPATIENT_CLINIC_OR_DEPARTMENT_OTHER): Payer: Self-pay | Admitting: Emergency Medicine

## 2013-03-01 DIAGNOSIS — M5431 Sciatica, right side: Secondary | ICD-10-CM

## 2013-03-01 DIAGNOSIS — M5430 Sciatica, unspecified side: Secondary | ICD-10-CM | POA: Insufficient documentation

## 2013-03-01 MED ORDER — HYDROCODONE-ACETAMINOPHEN 5-325 MG PO TABS: 1.0000 | ORAL_TABLET | ORAL | Status: DC | PRN

## 2013-03-01 MED ORDER — PREDNISONE 10 MG PO TABS: 20.0000 mg | ORAL_TABLET | Freq: Every day | ORAL | Status: DC

## 2013-03-01 MED ORDER — CYCLOBENZAPRINE HCL 10 MG PO TABS: 10.0000 mg | ORAL_TABLET | Freq: Two times a day (BID) | ORAL | Status: DC | PRN

## 2013-03-01 NOTE — ED Provider Notes (Signed)
Medical screening examination/treatment/procedure(s) were performed by non-physician practitioner and as supervising physician I was immediately available for consultation/collaboration.  EKG Interpretation   None         Charles B. Sheldon, MD 03/01/13 1515 

## 2013-03-01 NOTE — ED Notes (Signed)
Pt c/o lower back pain which radiates into right hip and down leg x 3 days after playing basketball

## 2013-03-01 NOTE — Discharge Instructions (Signed)
Sciatica °Sciatica is pain, weakness, numbness, or tingling along the path of the sciatic nerve. The nerve starts in the lower back and runs down the back of each leg. The nerve controls the muscles in the lower leg and in the back of the knee, while also providing sensation to the back of the thigh, lower leg, and the sole of your foot. Sciatica is a symptom of another medical condition. For instance, nerve damage or certain conditions, such as a herniated disk or bone spur on the spine, pinch or put pressure on the sciatic nerve. This causes the pain, weakness, or other sensations normally associated with sciatica. Generally, sciatica only affects one side of the body. °CAUSES  °· Herniated or slipped disc. °· Degenerative disk disease. °· A pain disorder involving the narrow muscle in the buttocks (piriformis syndrome). °· Pelvic injury or fracture. °· Pregnancy. °· Tumor (rare). °SYMPTOMS  °Symptoms can vary from mild to very severe. The symptoms usually travel from the low back to the buttocks and down the back of the leg. Symptoms can include: °· Mild tingling or dull aches in the lower back, leg, or hip. °· Numbness in the back of the calf or sole of the foot. °· Burning sensations in the lower back, leg, or hip. °· Sharp pains in the lower back, leg, or hip. °· Leg weakness. °· Severe back pain inhibiting movement. °These symptoms may get worse with coughing, sneezing, laughing, or prolonged sitting or standing. Also, being overweight may worsen symptoms. °DIAGNOSIS  °Your caregiver will perform a physical exam to look for common symptoms of sciatica. He or she may ask you to do certain movements or activities that would trigger sciatic nerve pain. Other tests may be performed to find the cause of the sciatica. These may include: °· Blood tests. °· X-rays. °· Imaging tests, such as an MRI or CT scan. °TREATMENT  °Treatment is directed at the cause of the sciatic pain. Sometimes, treatment is not necessary  and the pain and discomfort goes away on its own. If treatment is needed, your caregiver may suggest: °· Over-the-counter medicines to relieve pain. °· Prescription medicines, such as anti-inflammatory medicine, muscle relaxants, or narcotics. °· Applying heat or ice to the painful area. °· Steroid injections to lessen pain, irritation, and inflammation around the nerve. °· Reducing activity during periods of pain. °· Exercising and stretching to strengthen your abdomen and improve flexibility of your spine. Your caregiver may suggest losing weight if the extra weight makes the back pain worse. °· Physical therapy. °· Surgery to eliminate what is pressing or pinching the nerve, such as a bone spur or part of a herniated disk. °HOME CARE INSTRUCTIONS  °· Only take over-the-counter or prescription medicines for pain or discomfort as directed by your caregiver. °· Apply ice to the affected area for 20 minutes, 3 4 times a day for the first 48 72 hours. Then try heat in the same way. °· Exercise, stretch, or perform your usual activities if these do not aggravate your pain. °· Attend physical therapy sessions as directed by your caregiver. °· Keep all follow-up appointments as directed by your caregiver. °· Do not wear high heels or shoes that do not provide proper support. °· Check your mattress to see if it is too soft. A firm mattress may lessen your pain and discomfort. °SEEK IMMEDIATE MEDICAL CARE IF:  °· You lose control of your bowel or bladder (incontinence). °· You have increasing weakness in the lower back,   pelvis, buttocks, or legs. °· You have redness or swelling of your back. °· You have a burning sensation when you urinate. °· You have pain that gets worse when you lie down or awakens you at night. °· Your pain is worse than you have experienced in the past. °· Your pain is lasting longer than 4 weeks. °· You are suddenly losing weight without reason. °MAKE SURE YOU: °· Understand these  instructions. °· Will watch your condition. °· Will get help right away if you are not doing well or get worse. °Document Released: 01/13/2001 Document Revised: 07/21/2011 Document Reviewed: 05/31/2011 °[employer]® Patient Information ©2014 [employer]. ° °

## 2013-03-01 NOTE — ED Provider Notes (Signed)
CSN: [id number]     Arrival date & time 03/01/13  1402 History   First MD Initiated Contact with Patient 03/01/13 1421     Chief Complaint  Patient presents with  . Back Pain   (Consider location/radiation/quality/duration/timing/severity/associated sxs/prior Treatment) HPI Comments: Patient is otherwise healthy 28 year old male who presents with right lower back pain and buttock pain with radiation into the back of his right leg.  He states that this initially started several months ago - he saw his PCP who prescribed medication but this did not work.  He states that he started taking an over the counter herbal medication which did help.  He states that 3 days ago he was playing basketball and this worsened the pain - he reports pain worse with lying flat.  He denies weakness, numbness, tingling, loss of control of bowels or bladder.  Patient is a 28 y.o. male presenting with back pain. The history is provided by the patient. No language interpreter was used.  Back Pain Location:  Gluteal region Quality:  Stabbing and shooting Radiates to:  R posterior upper leg Pain severity:  Moderate Pain is:  Same all the time Onset quality:  Gradual Duration:  3 days Timing:  Constant Progression:  Worsening Chronicity:  Recurrent Context: not falling, not jumping from heights, not lifting heavy objects, not MCA, not MVA, not physical stress and not recent injury   Relieved by:  Nothing Worsened by:  Nothing tried Ineffective treatments:  None tried Associated symptoms: leg pain   Associated symptoms: no abdominal pain, no bladder incontinence, no bowel incontinence, no dysuria, no fever, no numbness, no paresthesias, no pelvic pain, no tingling and no weakness     History reviewed. No pertinent past medical history. History reviewed. No pertinent past surgical history. History reviewed. No pertinent family history. History  Substance Use Topics  . Smoking status: Never Smoker   . Smokeless  tobacco: Not on file  . Alcohol Use: Yes     Comment: weekends    Review of Systems  Constitutional: Negative for fever.  Gastrointestinal: Negative for abdominal pain and bowel incontinence.  Genitourinary: Negative for bladder incontinence, dysuria and pelvic pain.  Musculoskeletal: Positive for back pain.  Neurological: Negative for tingling, weakness, numbness and paresthesias.  All other systems reviewed and are negative.    Allergies  Review of patient's allergies indicates no known allergies.  Home Medications  No current outpatient prescriptions on file. BP 141/77  Pulse 92  Temp(Src) 98.1 F (36.7 C) (Oral)  Resp 16  Ht 6\' 3"  (1.905 m)  Wt 215 lb (97.523 kg)  BMI 26.87 kg/m2  SpO2 100% Physical Exam  Nursing note and vitals reviewed. Constitutional: He is oriented to person, place, and time. He appears well-developed and well-nourished. No distress.  HENT:  Head: Normocephalic and atraumatic.  Right Ear: External ear normal.  Left Ear: External ear normal.  Nose: Nose normal.  Mouth/Throat: Oropharynx is clear and moist. No oropharyngeal exudate.  Eyes: Conjunctivae are normal. No scleral icterus.  Neck: Normal range of motion. No spinous process tenderness and no muscular tenderness present.  Pulmonary/Chest: Effort normal.  Musculoskeletal:       Thoracic back: He exhibits normal range of motion, no tenderness and no bony tenderness.       Lumbar back: He exhibits tenderness and bony tenderness. He exhibits normal range of motion and no spasm.       Back:  + straight leg raise on the right - +  ttp to right SI joint  Neurological: He is alert and oriented to person, place, and time. He has normal strength. He displays no atrophy. No sensory deficit. He exhibits normal muscle tone. He displays a negative Romberg sign. Coordination and gait normal. GCS eye subscore is 4. GCS verbal subscore is 5. GCS motor subscore is 6.  Reflex Scores:      Patellar reflexes  are 2+ on the right side and 2+ on the left side.      Achilles reflexes are 2+ on the right side and 2+ on the left side. 5/5 strength bilaterally hip flexors, quads, hamstrings.  Skin: Skin is warm and dry. No rash noted. No erythema. No pallor.  Psychiatric: He has a normal mood and affect. His behavior is normal. Judgment and thought content normal.    ED Course  Procedures (including critical care time) Labs Review Labs Reviewed - No data to display Imaging Review No results found.  EKG Interpretation   None       MDM  Sciatica, right  Patient here with lower back and right leg pain - c/w sciatica, no alarming signs to suggest cord compression or cauda equina,  Will place on course of steroids, pain control and muscle relaxers.    Izola Price Marisue Humble, PA-C 03/01/13 1503

## 2013-03-05 ENCOUNTER — Emergency Department (HOSPITAL_BASED_OUTPATIENT_CLINIC_OR_DEPARTMENT_OTHER)
Admission: EM | Admit: 2013-03-05 | Discharge: 2013-03-05 | Disposition: A | Discharge status: Home / Self Care | Payer: BC Managed Care – PPO | Attending: Emergency Medicine | Admitting: Emergency Medicine

## 2013-03-05 ENCOUNTER — Encounter (HOSPITAL_BASED_OUTPATIENT_CLINIC_OR_DEPARTMENT_OTHER): Payer: Self-pay | Admitting: Emergency Medicine

## 2013-03-05 DIAGNOSIS — M79609 Pain in unspecified limb: Secondary | ICD-10-CM | POA: Insufficient documentation

## 2013-03-05 DIAGNOSIS — IMO0002 Reserved for concepts with insufficient information to code with codable children: Secondary | ICD-10-CM | POA: Insufficient documentation

## 2013-03-05 DIAGNOSIS — M545 Low back pain, unspecified: Secondary | ICD-10-CM

## 2013-03-05 DIAGNOSIS — G8911 Acute pain due to trauma: Secondary | ICD-10-CM | POA: Insufficient documentation

## 2013-03-05 MED ORDER — OXYCODONE-ACETAMINOPHEN 5-325 MG PO TABS: 1.0000 | ORAL_TABLET | ORAL | Status: DC | PRN

## 2013-03-05 NOTE — ED Provider Notes (Signed)
CSN: [id number]     Arrival date & time 03/05/13  1307 History   First MD Initiated Contact with Patient 03/05/13 1405     Chief Complaint  Patient presents with  . Back Pain   (Consider location/radiation/quality/duration/timing/severity/associated sxs/prior Treatment) Patient is a 28 y.o. male presenting with back pain. The history is provided by the patient.  Back Pain Location:  Lumbar spine Quality:  Shooting Radiates to:  R posterior upper leg Pain severity:  Severe Pain is:  Same all the time Onset quality:  Gradual Timing:  Constant Progression:  Unchanged Relieved by:  Nothing Worsened by:  Ambulation and movement Ineffective treatments:  Muscle relaxants (steroids) Associated symptoms: leg pain   Associated symptoms: no abdominal pain, no bladder incontinence, no bowel incontinence, no dysuria, no fever, no headaches, no numbness, no paresthesias and no weakness    Victor Morales is a 28 y.o. male who presents to the ED with low back pain that radiates to the right buttock and posterior thigh. He was here as week ago after injuring his back while playing basketball. He filled his Rx for prednisone and Flexeril but did not get the narcotic because he did not want to take it. He has continued to have pain and now thinks he may need a narcotic. He denies loss of control of bladder or bowel, no weakness, just pain. He works for [employer] and is required to lift and move boxes during his entire shift.    History reviewed. No pertinent past medical history. History reviewed. No pertinent past surgical history. History reviewed. No pertinent family history. History  Substance Use Topics  . Smoking status: Never Smoker   . Smokeless tobacco: Not on file  . Alcohol Use: Yes     Comment: weekends    Review of Systems  Constitutional: Negative for fever and chills.  HENT: Negative.   Gastrointestinal: Negative for nausea, vomiting, abdominal pain and bowel incontinence.    Genitourinary: Negative for bladder incontinence, dysuria and decreased urine volume.  Musculoskeletal: Positive for back pain.  Skin: Negative for wound.  Neurological: Negative for weakness, numbness, headaches and paresthesias.  Psychiatric/Behavioral: The patient is not nervous/anxious.     Allergies  Review of patient's allergies indicates no known allergies.  Home Medications   Current Outpatient Rx  Name  Route  Sig  Dispense  Refill  . cyclobenzaprine (FLEXERIL) 10 MG tablet   Oral   Take 1 tablet (10 mg total) by mouth 2 (two) times daily as needed for muscle spasms.   20 tablet   0   . HYDROcodone-acetaminophen (NORCO/VICODIN) 5-325 MG per tablet   Oral   Take 1 tablet by mouth every 4 (four) hours as needed.   20 tablet   0   . predniSONE (DELTASONE) 10 MG tablet   Oral   Take 2 tablets (20 mg total) by mouth daily.   15 tablet   0    BP 126/74  Pulse 91  Temp(Src) 98.1 F (36.7 C) (Oral)  Resp 18  SpO2 100% Physical Exam  Nursing note and vitals reviewed. Constitutional: He is oriented to person, place, and time. He appears well-developed and well-nourished.  HENT:  Head: Atraumatic.  Eyes: EOM are normal.  Neck: Neck supple.  Cardiovascular: Normal rate.   Pulmonary/Chest: Effort normal.  Abdominal: Soft. There is no tenderness.  Musculoskeletal: Normal range of motion.       Lumbar back: He exhibits pain and spasm. He exhibits normal range of motion  and normal pulse.       Back:  Pedal pulses equal. Straight leg raises on the left without difficulty. Pain with straight leg raises on the right. Pain with palpation over the right sciatic nerve. ambulatory with steady gait.   Neurological: He is alert and oriented to person, place, and time. He has normal strength and normal reflexes. No cranial nerve deficit or sensory deficit. Gait normal.  Reflex Scores:      Tricep reflexes are 2+ on the left side.      Bicep reflexes are 2+ on the right  side.      Brachioradialis reflexes are 2+ on the right side and 2+ on the left side.      Patellar reflexes are 2+ on the right side and 2+ on the left side. Skin: Skin is warm and dry.  Psychiatric: He has a normal mood and affect. His behavior is normal.    ED Course  Procedures   MDM  28 y.o. male with low back pain that has been persistent since last visit. He did not get his pain medication filled. He will fill his Rx this time and he will follow up with Dr. Pearletha ForgeHudnall. Discussed with the patient and all questioned fully answered. Stable for discharge without any additional screening indicated at this time. Normal neuro exam.  Janne NapoleonHope M Neese, NP 03/05/13 2130

## 2013-03-05 NOTE — ED Notes (Addendum)
Patient was seen on the 28th for back pain radiating down his right leg, states the medication is not working.

## 2013-03-06 NOTE — ED Provider Notes (Signed)
History/physical exam/procedure(s) were performed by non-physician practitioner and as supervising physician I was immediately available for consultation/collaboration. I have reviewed all notes and am in agreement with care and plan.   Yitzhak Awan S Davarion Cuffee, MD 03/06/13 1225 

## 2017-02-22 ENCOUNTER — Ambulatory Visit: Payer: Self-pay | Admitting: Family Medicine

## 2017-02-23 ENCOUNTER — Ambulatory Visit (INDEPENDENT_AMBULATORY_CARE_PROVIDER_SITE_OTHER): Payer: Managed Care, Other (non HMO) | Admitting: Family Medicine

## 2017-02-23 ENCOUNTER — Encounter: Payer: Self-pay | Admitting: Family Medicine

## 2017-02-23 ENCOUNTER — Other Ambulatory Visit: Payer: Self-pay

## 2017-02-23 ENCOUNTER — Ambulatory Visit: Payer: Self-pay | Admitting: Family Medicine

## 2017-02-23 VITALS — BP 122/78 | HR 88 | Temp 97.9°F | Ht 78.0 in | Wt 230.4 lb

## 2017-02-23 DIAGNOSIS — F4321 Adjustment disorder with depressed mood: Secondary | ICD-10-CM

## 2017-02-23 DIAGNOSIS — F411 Generalized anxiety disorder: Secondary | ICD-10-CM | POA: Diagnosis not present

## 2017-02-23 MED ORDER — BUSPIRONE HCL 7.5 MG PO TABS: 7.5000 mg | ORAL_TABLET | Freq: Two times a day (BID) | ORAL | 1 refills | Status: DC

## 2017-02-23 NOTE — Progress Notes (Signed)
   1/22/201911:34 AM  Victor PlantLuther Benjamin Morales 03-12-1985, 32 y.o. male [id number]  Chief Complaint  Patient presents with  . Establish Care    wants to talk to a doctor regarding personal issues, also wants to talk about depression    HPI:   Patient is a 32 y.o. male who presents today for wanting to discuss anxiety, grief. He states his grandmother died shortly after being diagnosed with breast cancer in late 2018. Prior to that she was a very vital, energetic woman. He was very close to his grandmother and is having a very hard time dealing with her death. 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 He is worried about something bad happening at any time. He is up for promotion and is worried about not making the cut. He exercises everyday. His family encouraged him to seek help. He denies having any previous issues with depression or anxiety. He denies any substance use. He denies any SI.  Depression screen PHQ 2/9 02/23/2017  Decreased Interest 1  Down, Depressed, Hopeless 1  PHQ - 2 Score 2  Altered sleeping 3  Tired, decreased energy 3  Change in appetite 0  Feeling bad or failure about yourself  1  Trouble concentrating 1  Moving slowly or fidgety/restless 2  Suicidal thoughts 0  PHQ-9 Score 12  Difficult doing work/chores Not difficult at all    No Known Allergies  Prior to Admission medications   Not on File    History reviewed. No pertinent past medical history.  History reviewed. No pertinent surgical history.  Social History   Tobacco Use  . Smoking status: Never Smoker  . Smokeless tobacco: Never Used  Substance Use Topics  . Alcohol use: Yes    Comment: weekends    Family History  Problem Relation Age of Onset  . Cancer Maternal Grandmother   . Diabetes Paternal Grandmother     ROS Per hpi  OBJECTIVE:  Blood pressure 122/78, pulse 88,  temperature 97.9 F (36.6 C), temperature source Oral, height 6\' 6"  (1.981 m), weight 230 lb 6.4 oz (104.5 kg), SpO2 94 %.  Physical Exam  Constitutional: He is oriented to person, place, and time and well-developed, well-nourished, and in no distress.  HENT:  Head: Normocephalic and atraumatic.  Mouth/Throat: Oropharynx is clear and moist.  Eyes: EOM are normal. Pupils are equal, round, and reactive to light.  Neck: Neck supple.  Pulmonary/Chest: Effort normal.  Neurological: He is alert and oriented to person, place, and time. Gait normal.  Skin: Skin is warm and dry.  Psychiatric: Mood and affect normal.  Nursing note and vitals reviewed.    ASSESSMENT and PLAN  1. Grief Discussed grief counseling, provided contact information to couple of agencies.   2. Anxiety state Discussed new med r/se/b.   Other orders - busPIRone (BUSPAR) 7.5 MG tablet; Take 1 tablet (7.5 mg total) by mouth 2 (two) times daily.  Return in about 2 weeks (around 03/09/2017).    Myles LippsIrma M Santiago, MD Primary Care at [facility] [address] [place], [place] [address] Ph.  [phone] Fax [phone]

## 2017-02-23 NOTE — Patient Instructions (Signed)
1. [facility] [phone]  2. [facility] [phone]

## 2017-03-09 ENCOUNTER — Other Ambulatory Visit: Payer: Self-pay

## 2017-03-09 ENCOUNTER — Encounter: Payer: Self-pay | Admitting: Family Medicine

## 2017-03-09 ENCOUNTER — Ambulatory Visit (INDEPENDENT_AMBULATORY_CARE_PROVIDER_SITE_OTHER): Payer: Managed Care, Other (non HMO) | Admitting: Family Medicine

## 2017-03-09 VITALS — BP 110/82 | HR 100 | Temp 98.7°F | Ht 74.0 in | Wt 232.0 lb

## 2017-03-09 DIAGNOSIS — F4322 Adjustment disorder with anxiety: Secondary | ICD-10-CM

## 2017-03-09 DIAGNOSIS — F4321 Adjustment disorder with depressed mood: Secondary | ICD-10-CM | POA: Diagnosis not present

## 2017-03-09 NOTE — Progress Notes (Signed)
   2/5/20191:42 PM  Victor Morales November 25, 1985, 32 y.o. male [id number]  Chief Complaint  Patient presents with  . Follow-up    ANXIETY/DGRIEF FROM LOSING GRANDMOTHER    HPI:  Patient is a 32 y.o. male  who presents today for followup on grief and anxiety. He decided not to start medication, he wants to give counseling a trial first. He has an upcoming appt. He has been exercising more to cope with anxiety. He has reached out to HR and has been advised to request FMLA to cover absence for appointments. He states he is doing better.   [facility] 03/09/2017 02/23/2017  Decreased Interest 0 1  Down, Depressed, Hopeless 0 1  PHQ - 2 Score 0 2  Altered sleeping - 3  Tired, decreased energy - 3  Change in appetite - 0  Feeling bad or failure about yourself  - 1  Trouble concentrating - 1  Moving slowly or fidgety/restless - 2  Suicidal thoughts - 0  PHQ-9 Score - 12  Difficult doing work/chores - Not difficult at all    No Known Allergies  Prior to Admission medications   Medication Sig Start Date End Date Taking? Authorizing Provider  busPIRone (BUSPAR) 7.5 MG tablet Take 1 tablet (7.5 mg total) by mouth 2 (two) times daily. 02/23/17  Yes Victor LippsSantiago, Salih Williamson M, MD    History reviewed. No pertinent past medical history.  History reviewed. No pertinent surgical history.  Social History   Tobacco Use  . Smoking status: Never Smoker  . Smokeless tobacco: Never Used  Substance Use Topics  . Alcohol use: Yes    Comment: weekends    Family History  Problem Relation Age of Onset  . Cancer Maternal Grandmother   . Diabetes Paternal Grandmother     ROS Per hpi  OBJECTIVE:  Blood pressure 110/82, pulse 100, temperature 98.7 F (37.1 C), temperature source Oral, height 6\' 2"  (1.88 m), weight 232 lb (105.2 kg), SpO2 100 %.  Physical Exam  Constitutional: He is oriented to person, place, and time and well-developed, well-nourished, and in no distress.  HENT:    Head: Normocephalic and atraumatic.  Mouth/Throat: Oropharynx is clear and moist.  Eyes: EOM are normal. Pupils are equal, round, and reactive to light.  Neck: Neck supple.  Pulmonary/Chest: Effort normal.  Neurological: He is alert and oriented to person, place, and time. Gait normal.  Skin: Skin is warm and dry.  Psychiatric: Mood and affect normal.  Nursing note and vitals reviewed.   ASSESSMENT and PLAN  1. Grief 2. Adjustment disorder with anxious mood Patient reports doing better, has upcoming appointment with grief counselor. He will bring FMLA paperwork for me to complete. He has decided to not start medication at this time, which is very reasonable.   Return in about 4 weeks (around 04/06/2017).    Victor LippsIrma M Santiago, MD Primary Care at [facility] [address] [place], [place] [address] Ph.  [phone] Fax [phone]

## 2017-03-09 NOTE — Patient Instructions (Addendum)
1. Please bring FMLA paperwork for me to complete   IF you received an x-ray today, you will receive an invoice from [facility]. Please contact [facility] at [phone] with questions or concerns regarding your invoice.   IF you received labwork today, you will receive an invoice from [facility]. Please contact [facility] at [phone] with questions or concerns regarding your invoice.   Our billing staff will not be able to assist you with questions regarding bills from these companies.  You will be contacted with the lab results as soon as they are available. The fastest way to get your results is to activate your My Chart account. Instructions are located on the last page of this paperwork. If you have not heard from us regarding the results in 2 weeks, please contact this office.

## 2017-03-13 ENCOUNTER — Encounter: Payer: Self-pay | Admitting: Family Medicine

## 2017-03-13 MED ORDER — BUSPIRONE HCL 7.5 MG PO TABS: 7.5000 mg | ORAL_TABLET | Freq: Two times a day (BID) | ORAL | 0 refills | Status: DC

## 2017-03-15 ENCOUNTER — Telehealth: Payer: Self-pay | Admitting: Family Medicine

## 2017-03-15 NOTE — Telephone Encounter (Signed)
Patient is needing FMLA forms completed for this job for this last OV for grief. I have completed what I could from the OV notes but I was not sure about the time frame for him missing work because I did not see any work notes in his chart. I will place the forms in Dr Adela GlimpseSantiago's box on 03/15/17 please return to the FMLA/Disability desk within 5-7 business days. Thank you!

## 2017-03-15 NOTE — Telephone Encounter (Signed)
Paperwork scanned and faxed on 03/15/17

## 2017-03-15 NOTE — Telephone Encounter (Signed)
Completed and put on your desk  

## 2017-03-22 ENCOUNTER — Telehealth: Payer: Self-pay | Admitting: Family Medicine

## 2017-03-22 NOTE — Telephone Encounter (Signed)
Copied from CRM [phone]. Topic: Quick Communication - See Telephone Encounter >> Mar 22, 2017  4:22 PM Terisa Starraylor, Brittany L wrote: CRM for notification. See Telephone encounter for:   03/22/17.  Patient said that Dr Leretha PolSantiago filled out his FMLA paperwork and faxed it back. He said that on Section A, he needs a specific time frame for his absence's & how long can an absence last? Example, how many days can he miss?  He said if he needs to drop the papers back off, he can. Please call him at [phone].

## 2017-03-25 NOTE — Telephone Encounter (Signed)
Pt states that he needs to have #8 filled out on the "illness" portion as well. Can write the same thing that is in the "treatment" portion on another location "up to 2x a week for a full day".   Ok to call back pt [phone]

## 2017-03-25 NOTE — Telephone Encounter (Signed)
Phone message re: F[facility](FMLA assistant).

## 2017-03-25 NOTE — Telephone Encounter (Signed)
Updated paperwork to have exact date on it for his time off--I have faxed the updated forms into [place] [facility] on 03/25/17  Will call patient and let him know.

## 2017-03-26 NOTE — Telephone Encounter (Signed)
Discussed with Deanglo Parodyaitlin - [facility] to her at [facility]

## 2017-03-29 NOTE — Telephone Encounter (Signed)
Paperwork updated per Dr Leretha PolSantiago and refaxed on 03/26/17

## 2017-04-06 ENCOUNTER — Ambulatory Visit: Payer: Managed Care, Other (non HMO) | Admitting: Family Medicine

## 2017-04-20 ENCOUNTER — Other Ambulatory Visit: Payer: Self-pay

## 2017-04-20 ENCOUNTER — Ambulatory Visit (INDEPENDENT_AMBULATORY_CARE_PROVIDER_SITE_OTHER): Payer: Managed Care, Other (non HMO) | Admitting: Family Medicine

## 2017-04-20 ENCOUNTER — Encounter: Payer: Self-pay | Admitting: Family Medicine

## 2017-04-20 VITALS — BP 117/84 | HR 95 | Temp 98.3°F | Ht 75.0 in | Wt 231.0 lb

## 2017-04-20 DIAGNOSIS — F4321 Adjustment disorder with depressed mood: Secondary | ICD-10-CM

## 2017-04-20 DIAGNOSIS — F4322 Adjustment disorder with anxiety: Secondary | ICD-10-CM

## 2017-04-20 NOTE — Progress Notes (Signed)
   3/19/201911:42 AM  Victor Morales 24-Aug-1985, 32 y.o. male [id number]  Chief Complaint  Patient presents with  . Follow-up    follow up on anxiety appt. Dealing with grief. Says he has yet to begin to take the meds given. He finds talking to family and counselor is therapuetic    HPI:   Patient is a 32 y.o. male with past medical history significant for grief and anxiety who presents today for followup  Has been reaching out to elders in [place], feels supported and able to process grief However anxiety, fearfulness, irritability still present, still interfering with everyday life activities Has reached out to counseling agencies Not interested in starting medication Feels that current symptoms are all stemming from sudden death of his grandmother Has no acute concerns today  PHQ 9 today = 12, no SI, very difficult doing work/chores  Depression screen [facility] 2/9 03/09/2017 02/23/2017  Decreased Interest 0 1  Down, Depressed, Hopeless 0 1  PHQ - 2 Score 0 2  Altered sleeping - 3  Tired, decreased energy - 3  Change in appetite - 0  Feeling bad or failure about yourself  - 1  Trouble concentrating - 1  Moving slowly or fidgety/restless - 2  Suicidal thoughts - 0  PHQ-9 Score - 12  Difficult doing work/chores - Not difficult at all    No Known Allergies  Prior to Admission medications   Medication Sig Start Date End Date Taking? Authorizing Provider  busPIRone (BUSPAR) 7.5 MG tablet Take 1 tablet (7.5 mg total) by mouth 2 (two) times daily. Patient not taking: Reported on 04/20/2017 03/13/17   [name], Wrangler Penning M, MD    History reviewed. No pertinent past medical history.  History reviewed. No pertinent surgical history.  Social History   Tobacco Use  . Smoking status: Never Smoker  . Smokeless tobacco: Never Used  Substance Use Topics  . Alcohol use: Yes    Comment: weekends    Family History  Problem Relation Age of Onset  . Cancer Maternal  Grandmother   . Diabetes Paternal Grandmother     ROS Per hpi  OBJECTIVE:  Blood pressure 117/84, pulse 95, temperature 98.3 F (36.8 C), temperature source Oral, height 6\' 3"  (1.905 m), weight 231 lb (104.8 kg), SpO2 98 %.  Physical Exam  Constitutional: He is oriented to person, place, and time and well-developed, well-nourished, and in no distress.  HENT:  Head: Normocephalic and atraumatic.  Mouth/Throat: Oropharynx is clear and moist.  Eyes: EOM are normal. Pupils are equal, round, and reactive to light.  Neck: Neck supple.  Pulmonary/Chest: Effort normal.  Neurological: He is alert and oriented to person, place, and time. Gait normal.  Skin: Skin is warm and dry.  Psychiatric: Mood and affect normal.  Nursing note and vitals reviewed.    ASSESSMENT and PLAN  1. Grief 2. Adjustment disorder with anxious mood  Encouraged patient to seek out formal counseling as anxiety and depression continue to negatively impact his daily living. Continue to seek support from family and friends for grief.   Return in about 2 months (around 06/20/2017).    Myles LippsIrma M Santiago, MD Primary Care at [facility] [address] [place], [place] [address] Ph.  [phone] Fax [phone]

## 2017-04-20 NOTE — Patient Instructions (Signed)
     IF you received an x-ray today, you will receive an invoice from [facility]. Please contact [facility] at [phone] with questions or concerns regarding your invoice.   IF you received labwork today, you will receive an invoice from [facility]. Please contact [facility] at [phone] with questions or concerns regarding your invoice.   Our billing staff will not be able to assist you with questions regarding bills from these companies.  You will be contacted with the lab results as soon as they are available. The fastest way to get your results is to activate your My Chart account. Instructions are located on the last page of this paperwork. If you have not heard from us regarding the results in 2 weeks, please contact this office.     

## 2017-04-29 NOTE — Telephone Encounter (Signed)
Patient would like the FMLA forms to be extended to 07/24/17

## 2017-04-30 NOTE — Telephone Encounter (Signed)
Patient wants to extend his FMLA forms from 04/26/17 to 07/24/17 he was told at his last OV with Dr Leretha PolSantiago that he needed to make an appt in May 2019 for a follow up--he has not made this appt as of yet. Do you want me to extend the forms or does the patient need to come back in for an OV with you?

## 2017-05-04 NOTE — Telephone Encounter (Signed)
Ok thank you I will let them know

## 2017-05-04 NOTE — Telephone Encounter (Signed)
Is this extension ok to put on pts paperwork?

## 2017-05-04 NOTE — Telephone Encounter (Signed)
Not really as he is not going to any formal therapy, counseling. thanks

## 2017-05-05 ENCOUNTER — Telehealth: Payer: Self-pay | Admitting: Family Medicine

## 2017-05-05 NOTE — Telephone Encounter (Signed)
Copied from CRM [id number]. Topic: General - Other >> May 05, 2017  3:28 PM Stephannie LiSimmons, Fronnie Urton L, NT wrote: Reason for CRM: Patient called and would like for his FMLA to  be extended the paperwork is already at the practice please call him at [phone] to discuss

## 2017-05-06 NOTE — Telephone Encounter (Signed)
Please Advise

## 2017-05-07 NOTE — Telephone Encounter (Signed)
I left a detailed message on confirmed voice mail explaining that I will NOT extend FMLA paperwork as not appropriate at this time due to no medical interventions, recurring appointments, etc.

## 2018-06-21 ENCOUNTER — Telehealth: Payer: Self-pay | Admitting: Family Medicine

## 2018-06-21 NOTE — Telephone Encounter (Signed)
LVM to schedule an appt with Dr. Leretha Pol per CRM

## 2018-08-30 ENCOUNTER — Telehealth: Payer: Self-pay | Admitting: Family Medicine

## 2018-08-30 ENCOUNTER — Other Ambulatory Visit: Payer: Self-pay

## 2018-08-30 ENCOUNTER — Encounter: Payer: Self-pay | Admitting: Family Medicine

## 2018-08-30 ENCOUNTER — Telehealth (INDEPENDENT_AMBULATORY_CARE_PROVIDER_SITE_OTHER): Payer: Managed Care, Other (non HMO) | Admitting: Family Medicine

## 2018-08-30 VITALS — Temp 98.7°F | Ht 75.0 in | Wt 217.0 lb

## 2018-08-30 DIAGNOSIS — F4321 Adjustment disorder with depressed mood: Secondary | ICD-10-CM | POA: Diagnosis not present

## 2018-08-30 DIAGNOSIS — F4322 Adjustment disorder with anxiety: Secondary | ICD-10-CM | POA: Insufficient documentation

## 2018-08-30 MED ORDER — BUSPIRONE HCL 7.5 MG PO TABS: 7.5000 mg | ORAL_TABLET | Freq: Two times a day (BID) | ORAL | 1 refills | Status: DC

## 2018-08-30 NOTE — Progress Notes (Signed)
Patient is stating that he has been having stress and anxiety issues for sometime, he is no longer taking the Buspar. He is stating that he wants to talk to Dr Holly Bodily about taking him out of work at this time

## 2018-08-30 NOTE — Telephone Encounter (Signed)
Pt will need to see psy for evaluation-referral ordered

## 2018-08-30 NOTE — Telephone Encounter (Signed)
Copied from [place] [id number]. Topic: General - Inquiry >> Aug 30, 2018  2:26 PM Richardo Priest, [place] wrote: Reason for CRM: Patient called in stating he would like to discuss some things with Dr.Corum if possible. States he reached out to pyschiatrist and none can do virtual. Please advise and patient is looking for next step. Call back is [phone].

## 2018-08-30 NOTE — Progress Notes (Signed)
Telemedicine Encounter- SOAP NOTE Established Patient  I discussed the limitations, risks, security and privacy concerns of performing an evaluation and management service by telephone and the availability of in person appointments. I also discussed with the patient that there may be a patient responsible charge related to this service. The patient expressed understanding and agreed to proceed.  This telephone encounter was conducted with the patient's  verbal consent via audio telecommunications: yes Patient was instructed to have this encounter in a suitably private space; and to only have persons present to whom they give permission to participate. In addition, patient identity was confirmed by use of name plus two identifiers (DOB and address).  I spent a total of 20min talking with the patient .  Cc:  Victor Morales is a 33 y.o. male established patient. Telephone visit today for grief, depression and anxiety  HPI Patient is stating that he has been having stress and anxiety issues for sometime, he is no longer taking the Buspar-states he took Buspar a month. 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 Marital status: Single    Spouse name: Not on file  . Number of children: Not on file  . Years of education: Not on file  . Highest education level: Not on file  Occupational History  . Not on file  Social Needs  . Financial resource strain: Not on file  . Food insecurity    Worry: Not on file    Inability: Not on file  . Transportation needs    Medical: Not on file    Non-medical: Not on file  Tobacco Use  . Smoking status: Never Smoker  . Smokeless tobacco: Never Used  Substance and Sexual Activity  . Alcohol use: Yes    Comment: weekends  . Drug use: No  .  Sexual activity: Not on file  Lifestyle  . Physical activity    Days per week: Not on file    Minutes per session: Not on file  . Stress: Not on file  Relationships  . Social Musicianconnections    Talks on phone: Not on file    Gets together: Not on file    Attends religious service: Not on file    Active member of club or organization: Not on file    Attends meetings of clubs or organizations: Not on file    Relationship status: Not on file  . Intimate partner violence    Fear of current or ex partner: Not on file    Emotionally abused: Not on file    Physically abused: Not on file    Forced sexual activity: Not on file  Other Topics Concern  . Not on file  Social History Narrative  . Not on file    Review of Systems  Psychiatric/Behavioral: Positive for depression. Negative for suicidal ideas. The patient is nervous/anxious.     Objective   Vitals as reported by the patient: Today's Vitals   08/30/18 1214  Temp: 98.7 F (37.1 C)  TempSrc: Oral  Weight: 217 lb (98.4 kg)  Height: 6\' 3"  (1.905 m)   1. Grief D/w pt concern for currently pt not taking medication or participating in counseling. Pt has good friend support and spoke to counselor in the past but did not  feel he needed medication. Ongoing concerns since Jan.   - Ambulatory referral to Psychiatry  2. Adjustment disorder with anxious mood D/w pt need to start Buspar-rx. Pt started on medication previously but did not continue taking the medication.  Pt has not accessed EAP through work or seen psy or counseling. Pt requesting time off. D/w pt patient at length referral to psychiatry for evaluation for an ongoing program for diagnosis and treatment options.  Reviewed previous noted from Dr. Mathis Bud made 04/21/18 to seek counseling and take medication for diagnosis. Decision by Dr Pamella Pert not to extend FMLA made 05/2018 with concerns that pt had not had a medical intervention or appointments to discuss  ongoing treatment plan. Appointment was request by Dr. Pamella Pert on 06/21/18 for evaluation to patient but no appointment was made - Ambulatory referral to Psychiatry  I discussed the assessment and treatment plan with the patient. The patient was provided an opportunity to ask questions and all were answered. The patient agreed with the plan and demonstrated an understanding of the instructions. The patient will see a counselor and start taking Buspar. The patient will see a psychiatrist to determine an ongoing plan for treatment with appropriate recommendations for medication and counseling.  FMLA will not be completed by primary care.     The patient was advised to call back or seek an in-person evaluation if the symptoms worsen or if the condition fails to improve as anticipated.  I provided 20 minutes of non-face-to-face time during this encounter.  LISA Hannah Beat, MD  Primary Care at [facility] 08-30-18

## 2018-08-31 NOTE — Telephone Encounter (Signed)
I have called the pt and relayed the message to pt via Voicemail.

## 2018-09-26 DIAGNOSIS — F4322 Adjustment disorder with anxiety: Secondary | ICD-10-CM | POA: Diagnosis not present

## 2019-10-24 ENCOUNTER — Ambulatory Visit (INDEPENDENT_AMBULATORY_CARE_PROVIDER_SITE_OTHER): Payer: BC Managed Care – PPO | Admitting: Registered Nurse

## 2019-10-24 ENCOUNTER — Encounter: Payer: Self-pay | Admitting: Registered Nurse

## 2019-10-24 ENCOUNTER — Other Ambulatory Visit: Payer: Self-pay

## 2019-10-24 ENCOUNTER — Other Ambulatory Visit (HOSPITAL_COMMUNITY)
Admission: RE | Admit: 2019-10-24 | Discharge: 2019-10-24 | Disposition: A | Discharge status: Home / Self Care | Payer: BC Managed Care – PPO | Source: Ambulatory Visit | Attending: Registered Nurse | Admitting: Registered Nurse

## 2019-10-24 VITALS — BP 115/74 | HR 83 | Temp 98.2°F | Resp 18 | Ht 75.0 in | Wt 206.8 lb

## 2019-10-24 DIAGNOSIS — R4582 Worries: Secondary | ICD-10-CM | POA: Insufficient documentation

## 2019-10-24 DIAGNOSIS — Z113 Encounter for screening for infections with a predominantly sexual mode of transmission: Secondary | ICD-10-CM | POA: Diagnosis not present

## 2019-10-24 DIAGNOSIS — Z1329 Encounter for screening for other suspected endocrine disorder: Secondary | ICD-10-CM | POA: Diagnosis not present

## 2019-10-24 DIAGNOSIS — Z130 Encounter for screening for diseases of the blood and blood-forming organs and certain disorders involving the immune mechanism: Secondary | ICD-10-CM

## 2019-10-24 DIAGNOSIS — Z13228 Encounter for screening for other metabolic disorders: Secondary | ICD-10-CM | POA: Diagnosis not present

## 2019-10-24 DIAGNOSIS — Z13220 Encounter for screening for lipoid disorders: Secondary | ICD-10-CM

## 2019-10-24 NOTE — Progress Notes (Signed)
Established Patient Office Visit  Subjective:  Patient ID: Victor Morales, male    DOB: November 08, 1985  Age: 34 y.o. MRN: [id number]  CC:  Chief Complaint  Patient presents with   Labs Only    Patient states he has not been to the doctor in awhile and would like to get STD testing and labs    HPI [name] presents for STD screen and blood work, plans to return for CPE next week  Does have some headaches. Worse at end of day and when lying down. Feels like pressure. Denies further neuro symptoms, CV symptoms, or any other concerns  Routine std screening - no symptoms or known exposure. No new partners of concern at this time   No past medical history on file.  No past surgical history on file.  Family History  Problem Relation Age of Onset   Cancer Maternal Grandmother    Diabetes Paternal Grandmother     Social History   Socioeconomic History   Marital status: Single    Spouse name: Not on file   Number of children: Not on file   Years of education: Not on file   Highest education level: Not on file  Occupational History   Not on file  Tobacco Use   Smoking status: Never Smoker   Smokeless tobacco: Never Used  Substance and Sexual Activity   Alcohol use: Yes    Comment: weekends   Drug use: No   Sexual activity: Not on file  Other Topics Concern   Not on file  Social History Narrative   Not on file   Social Determinants of Health   Financial Resource Strain:    Difficulty of Paying Living Expenses: Not on file  Food Insecurity:    Worried About [place] in the Last Year: Not on file   Ran Out of Food in the Last Year: Not on file  Transportation Needs:    Lack of Transportation (Medical): Not on file   Lack of Transportation (Non-Medical): Not on file  Physical Activity:    Days of Exercise per Week: Not on file   Minutes of Exercise per Session: Not on file  Stress:    Feeling of Stress : Not on file    Social Connections:    Frequency of Communication with Friends and Family: Not on file   Frequency of Social Gatherings with Friends and Family: Not on file   Attends Religious Services: Not on file   Active Member of Clubs or Organizations: Not on file   Attends Archivist Meetings: Not on file   Marital Status: Not on file  Intimate Partner Violence:    Fear of Current or Ex-Partner: Not on file   Emotionally Abused: Not on file   Physically Abused: Not on file   Sexually Abused: Not on file    Outpatient Medications Prior to Visit  Medication Sig Dispense Refill   busPIRone (BUSPAR) 7.5 MG tablet Take 1 tablet (7.5 mg total) by mouth 2 (two) times daily. (Patient not taking: Reported on 10/24/2019) 60 tablet 1   No facility-administered medications prior to visit.    No Known Allergies  ROS Review of Systems  Constitutional: Negative.   HENT: Negative.   Eyes: Negative.   Respiratory: Negative.   Cardiovascular: Negative.   Gastrointestinal: Negative.   Endocrine: Negative.   Genitourinary: Negative.   Musculoskeletal: Negative.   Skin: Negative.   Allergic/Immunologic: Negative.   Neurological: Positive for  headaches. Negative for dizziness, tremors, seizures, syncope, facial asymmetry, speech difficulty, weakness, light-headedness and numbness.  Hematological: Negative.   Psychiatric/Behavioral: Negative.       Objective:    Physical Exam Constitutional:      General: He is not in acute distress.    Appearance: Normal appearance. He is normal weight. He is not ill-appearing, toxic-appearing or diaphoretic.  Cardiovascular:     Rate and Rhythm: Normal rate and regular rhythm.     Heart sounds: Normal heart sounds. No murmur heard.  No friction rub. No gallop.   Pulmonary:     Effort: Pulmonary effort is normal. No respiratory distress.     Breath sounds: Normal breath sounds. No stridor. No wheezing, rhonchi or rales.  Chest:     Chest  wall: No tenderness.  Neurological:     General: No focal deficit present.     Mental Status: He is alert and oriented to person, place, and time. Mental status is at baseline.  Psychiatric:        Mood and Affect: Mood normal.        Behavior: Behavior normal.        Thought Content: Thought content normal.        Judgment: Judgment normal.     BP 115/74    Pulse 83    Temp 98.2 F (36.8 C) (Temporal)    Resp 18    Ht '6\' 3"'  (1.905 m)    Wt 206 lb 12.8 oz (93.8 kg)    SpO2 97%    BMI 25.85 kg/m  Wt Readings from Last 3 Encounters:  10/24/19 206 lb 12.8 oz (93.8 kg)  08/30/18 217 lb (98.4 kg)  04/20/17 231 lb (104.8 kg)     Health Maintenance Due  Topic Date Due   Hepatitis C Screening  Never done   HIV Screening  Never done   TETANUS/TDAP  Never done   INFLUENZA VACCINE  Never done    There are no preventive care reminders to display for this patient.  No results found for: TSH No results found for: WBC, HGB, HCT, MCV, PLT No results found for: NA, K, CHLORIDE, CO2, GLUCOSE, BUN, CREATININE, BILITOT, ALKPHOS, AST, ALT, PROT, ALBUMIN, CALCIUM, ANIONGAP, EGFR, GFR No results found for: CHOL No results found for: HDL No results found for: LDLCALC No results found for: TRIG No results found for: CHOLHDL No results found for: HGBA1C    Assessment & Plan:   Problem List Items Addressed This Visit    None    Visit Diagnoses    Screen for STD (sexually transmitted disease)    -  Primary   Relevant Orders   HIV antibody (with reflex)   Urine cytology ancillary only   RPR   Worries       Relevant Orders   HIV antibody (with reflex)   Urine cytology ancillary only   RPR   Screening for endocrine, metabolic and immunity disorder       Relevant Orders   CBC With Differential   Comprehensive metabolic panel   TSH   Hemoglobin A1c   Lipid screening       Relevant Orders   Lipid panel      No orders of the defined types were placed in this  encounter.   Follow-up: Return if symptoms worsen or fail to improve, for CPE - labs already done.   PLAN  Return for CPE  Labs collected today, will follow up as warranted  Suspect tension type headache  Patient encouraged to call clinic with any questions, comments, or concerns.  Maximiano Coss, NP

## 2019-10-24 NOTE — Patient Instructions (Signed)
° ° ° °  If you have lab work done today you will be contacted with your lab results within the next 2 weeks.  If you have not heard from us then please contact us. The fastest way to get your results is to register for My Chart. ° ° °IF you received an x-ray today, you will receive an invoice from [facility]. Please contact [facility] at [phone] with questions or concerns regarding your invoice.  ° °IF you received labwork today, you will receive an invoice from [facility]. Please contact [facility] at [phone] with questions or concerns regarding your invoice.  ° °Our billing staff will not be able to assist you with questions regarding bills from these companies. ° °You will be contacted with the lab results as soon as they are available. The fastest way to get your results is to activate your My Chart account. Instructions are located on the last page of this paperwork. If you have not heard from us regarding the results in 2 weeks, please contact this office. °  ° ° ° °

## 2019-10-25 ENCOUNTER — Encounter: Payer: Self-pay | Admitting: Registered Nurse

## 2019-10-25 LAB — COMPREHENSIVE METABOLIC PANEL
ALT: 17 IU/L (ref 0–44)
AST: 25 IU/L (ref 0–40)
Albumin/Globulin Ratio: 1.7 (ref 1.2–2.2)
Albumin: 4.5 g/dL (ref 4.0–5.0)
Alkaline Phosphatase: 60 IU/L (ref 44–121)
BUN/Creatinine Ratio: 22 — ABNORMAL HIGH (ref 9–20)
BUN: 19 mg/dL (ref 6–20)
Bilirubin Total: 0.7 mg/dL (ref 0.0–1.2)
CO2: 22 mmol/L (ref 20–29)
Calcium: 9.3 mg/dL (ref 8.7–10.2)
Chloride: 104 mmol/L (ref 96–106)
Creatinine, Ser: 0.88 mg/dL (ref 0.76–1.27)
GFR calc Af Amer: 131 mL/min/{1.73_m2} (ref >59)
GFR calc non Af Amer: 114 mL/min/{1.73_m2} (ref >59)
Globulin, Total: 2.6 g/dL (ref 1.5–4.5)
Glucose: 97 mg/dL (ref 65–99)
Potassium: 4 mmol/L (ref 3.5–5.2)
Sodium: 142 mmol/L (ref 134–144)
Total Protein: 7.1 g/dL (ref 6.0–8.5)

## 2019-10-25 LAB — CBC WITH DIFFERENTIAL
Basophils Absolute: 0 10*3/uL (ref 0.0–0.2)
Basos: 1 %
EOS (ABSOLUTE): 0.1 10*3/uL (ref 0.0–0.4)
Eos: 1 %
Hematocrit: 40.9 % (ref 37.5–51.0)
Hemoglobin: 14 g/dL (ref 13.0–17.7)
Immature Grans (Abs): 0 10*3/uL (ref 0.0–0.1)
Immature Granulocytes: 0 %
Lymphocytes Absolute: 1.8 10*3/uL (ref 0.7–3.1)
Lymphs: 36 %
MCH: 31.7 pg (ref 26.6–33.0)
MCHC: 34.2 g/dL (ref 31.5–35.7)
MCV: 93 fL (ref 79–97)
Monocytes Absolute: 0.5 10*3/uL (ref 0.1–0.9)
Monocytes: 9 %
Neutrophils Absolute: 2.6 10*3/uL (ref 1.4–7.0)
Neutrophils: 53 %
RBC: 4.42 x10E6/uL (ref 4.14–5.80)
RDW: 11.9 % (ref 11.6–15.4)
WBC: 5 10*3/uL (ref 3.4–10.8)

## 2019-10-25 LAB — LIPID PANEL
Chol/HDL Ratio: 2.8 ratio (ref 0.0–5.0)
Cholesterol, Total: 163 mg/dL (ref 100–199)
HDL: 59 mg/dL (ref >39)
LDL Chol Calc (NIH): 97 mg/dL (ref 0–99)
Triglycerides: 30 mg/dL (ref 0–149)
VLDL Cholesterol Cal: 7 mg/dL (ref 5–40)

## 2019-10-25 LAB — HEMOGLOBIN A1C
Est. average glucose Bld gHb Est-mCnc: 105 mg/dL
Hgb A1c MFr Bld: 5.3 % (ref 4.8–5.6)

## 2019-10-25 LAB — RPR: RPR Ser Ql: NONREACTIVE

## 2019-10-25 LAB — TSH: TSH: 0.999 u[IU]/mL (ref 0.450–4.500)

## 2019-10-25 LAB — HIV ANTIBODY (ROUTINE TESTING W REFLEX): HIV Screen 4th Generation wRfx: NONREACTIVE

## 2019-10-27 LAB — URINE CYTOLOGY ANCILLARY ONLY
Chlamydia: NEGATIVE
Comment: NEGATIVE
Comment: NEGATIVE
Comment: NORMAL
Neisseria Gonorrhea: NEGATIVE
Trichomonas: NEGATIVE

## 2019-10-31 ENCOUNTER — Ambulatory Visit: Payer: BC Managed Care – PPO | Admitting: Registered Nurse

## 2019-10-31 ENCOUNTER — Other Ambulatory Visit: Payer: Self-pay

## 2019-10-31 DIAGNOSIS — Z1152 Encounter for screening for COVID-19: Secondary | ICD-10-CM

## 2019-11-01 LAB — SARS-COV-2, NAA 2 DAY TAT

## 2019-11-01 LAB — NOVEL CORONAVIRUS, NAA: SARS-CoV-2, NAA: NOT DETECTED

## 2019-11-30 ENCOUNTER — Telehealth (INDEPENDENT_AMBULATORY_CARE_PROVIDER_SITE_OTHER): Payer: BC Managed Care – PPO | Admitting: Emergency Medicine

## 2019-11-30 ENCOUNTER — Other Ambulatory Visit: Payer: Self-pay

## 2019-11-30 ENCOUNTER — Encounter: Payer: Self-pay | Admitting: Emergency Medicine

## 2019-11-30 VITALS — Temp 102.0°F

## 2019-11-30 DIAGNOSIS — R6889 Other general symptoms and signs: Secondary | ICD-10-CM

## 2019-11-30 DIAGNOSIS — B349 Viral infection, unspecified: Secondary | ICD-10-CM

## 2019-11-30 NOTE — Progress Notes (Signed)
Telemedicine Encounter- SOAP NOTE Established Patient My chart video conference This video telephone encounter was conducted with the patient's (or proxy's) verbal consent via video audio telecommunications: yes/no: Yes Patient was instructed to have this encounter in a suitably private space; and to only have persons present to whom they give permission to participate. In addition, patient identity was confirmed by use of name plus two identifiers (DOB and address).  I discussed the limitations, risks, security and privacy concerns of performing an evaluation and management service by telephone and the availability of in person appointments. I also discussed with the patient that there may be a patient responsible charge related to this service. The patient expressed understanding and agreed to proceed.  I spent a total of TIME; 0 MIN TO 60 MIN: 10 minutes talking with the patient or their proxy.  Chief Complaint  Patient presents with  . Headache    pt has had a headache and fever, fatigued, nauseous since yesterday, pt is not COVID vaccinated, pt doe not know of any exposure at this time, pt may need COVID test for work     Subjective   Victor Morales is a 34 y.o. male established patient. Telephone visit today complaining of flulike symptoms started about 48 hours ago.  Not Covid vaccinated.  Feels better today blood work requires [employer] and work note.  HPI   Patient Active Problem List   Diagnosis Date Noted  . Grief 08/30/2018  . Adjustment disorder with anxious mood 08/30/2018    No past medical history on file.  Current Outpatient Medications  Medication Sig Dispense Refill  . busPIRone (BUSPAR) 7.5 MG tablet Take 1 tablet (7.5 mg total) by mouth 2 (two) times daily. (Patient not taking: Reported on 10/24/2019) 60 tablet 1   No current facility-administered medications for this visit.    No Known Allergies  Social History   Socioeconomic History  .  Marital status: Single    Spouse name: Not on file  . Number of children: Not on file  . Years of education: Not on file  . Highest education level: Not on file  Occupational History  . Not on file  Tobacco Use  . Smoking status: Never Smoker  . Smokeless tobacco: Never Used  Substance and Sexual Activity  . Alcohol use: Yes    Comment: weekends  . Drug use: No  . Sexual activity: Not on file  Other Topics Concern  . Not on file  Social History Narrative  . Not on file   Social Determinants of Health   Financial Resource Strain:   . Difficulty of Paying Living Expenses: Not on file  Food Insecurity:   . Worried About Programme researcher, broadcasting/film/video in the Last Year: Not on file  . Ran Out of Food in the Last Year: Not on file  Transportation Needs:   . Lack of Transportation (Medical): Not on file  . Lack of Transportation (Non-Medical): Not on file  Physical Activity:   . Days of Exercise per Week: Not on file  . Minutes of Exercise per Session: Not on file  Stress:   . Feeling of Stress : Not on file  Social Connections:   . Frequency of Communication with Friends and Family: Not on file  . Frequency of Social Gatherings with Friends and Family: Not on file  . Attends Religious Services: Not on file  . Active Member of Clubs or Organizations: Not on file  . Attends Club or  Organization Meetings: Not on file  . Marital Status: Not on file  Intimate Partner Violence:   . Fear of Current or Ex-Partner: Not on file  . Emotionally Abused: Not on file  . Physically Abused: Not on file  . Sexually Abused: Not on file    Review of Systems  Constitutional: Positive for fever and malaise/fatigue.  HENT: Positive for congestion. Negative for sore throat.   Respiratory: Positive for cough.   Cardiovascular: Negative for chest pain and palpitations.  Gastrointestinal: Negative for abdominal pain, diarrhea, nausea and vomiting.  Musculoskeletal: Positive for myalgias.  Skin: Negative.    Neurological: Negative for dizziness and headaches.  All other systems reviewed and are negative.   Objective  Alert and oriented x3 in no apparent respiratory distress Vitals as reported by the patient: Today's Vitals   11/30/19 1608  Temp: (!) 102 F (38.9 C)  TempSrc: Oral    There are no diagnoses linked to this encounter. [name] was seen today for headache.  Diagnoses and all orders for this visit:  Viral illness  Flu-like symptoms -     Novel Coronavirus, NAA ([facility])     I discussed the assessment and treatment plan with the patient. The patient was provided an opportunity to ask questions and all were answered. The patient agreed with the plan and demonstrated an understanding of the instructions.   The patient was advised to call back or seek an in-person evaluation if the symptoms worsen or if the condition fails to improve as anticipated.  I provided 10 minutes of non-face-to-face time during this encounter.  Georgina Quint, MD  Primary Care at [facility]

## 2019-11-30 NOTE — Patient Instructions (Signed)
° ° ° °  If you have lab work done today you will be contacted with your lab results within the next 2 weeks.  If you have not heard from us then please contact us. The fastest way to get your results is to register for My Chart. ° ° °IF you received an x-ray today, you will receive an invoice from [facility]. Please contact  Radiology at [phone] with questions or concerns regarding your invoice.  ° °IF you received labwork today, you will receive an invoice from [facility]. Please contact [facility] at [phone] with questions or concerns regarding your invoice.  ° °Our billing staff will not be able to assist you with questions regarding bills from these companies. ° °You will be contacted with the lab results as soon as they are available. The fastest way to get your results is to activate your My Chart account. Instructions are located on the last page of this paperwork. If you have not heard from us regarding the results in 2 weeks, please contact this office. °  ° ° ° °

## 2019-12-01 ENCOUNTER — Ambulatory Visit (INDEPENDENT_AMBULATORY_CARE_PROVIDER_SITE_OTHER): Payer: BC Managed Care – PPO | Admitting: Family Medicine

## 2019-12-01 DIAGNOSIS — R69 Illness, unspecified: Secondary | ICD-10-CM | POA: Diagnosis not present

## 2019-12-02 LAB — SARS-COV-2, NAA 2 DAY TAT

## 2019-12-02 LAB — NOVEL CORONAVIRUS, NAA: SARS-CoV-2, NAA: NOT DETECTED

## 2020-02-07 ENCOUNTER — Telehealth (INDEPENDENT_AMBULATORY_CARE_PROVIDER_SITE_OTHER): Payer: BC Managed Care – PPO | Admitting: Registered Nurse

## 2020-02-07 ENCOUNTER — Other Ambulatory Visit: Payer: Self-pay

## 2020-02-07 ENCOUNTER — Encounter: Payer: Self-pay | Admitting: Registered Nurse

## 2020-02-07 VITALS — Ht 75.0 in | Wt 205.0 lb

## 2020-02-07 DIAGNOSIS — J029 Acute pharyngitis, unspecified: Secondary | ICD-10-CM

## 2020-02-07 NOTE — Patient Instructions (Signed)
° ° ° °  If you have lab work done today you will be contacted with your lab results within the next 2 weeks.  If you have not heard from us then please contact us. The fastest way to get your results is to register for My Chart. ° ° °IF you received an x-ray today, you will receive an invoice from [facility]. Please contact [facility] at [phone] with questions or concerns regarding your invoice.  ° °IF you received labwork today, you will receive an invoice from [facility]. Please contact [facility] at [phone] with questions or concerns regarding your invoice.  ° °Our billing staff will not be able to assist you with questions regarding bills from these companies. ° °You will be contacted with the lab results as soon as they are available. The fastest way to get your results is to activate your My Chart account. Instructions are located on the last page of this paperwork. If you have not heard from us regarding the results in 2 weeks, please contact this office. °  ° ° ° °

## 2020-02-07 NOTE — Progress Notes (Signed)
Telemedicine Encounter- SOAP NOTE Established Patient  This telephone encounter was conducted with the patient's (or proxy's) verbal consent via audio telecommunications: yes  Patient was instructed to have this encounter in a suitably private space; and to only have persons present to whom they give permission to participate. In addition, patient identity was confirmed by use of name plus two identifiers (DOB and address).  I discussed the limitations, risks, security and privacy concerns of performing an evaluation and management service by telephone and the availability of in person appointments. I also discussed with the patient that there may be a patient responsible charge related to this service. The patient expressed understanding and agreed to proceed.  I spent a total of 15 talking with the patient or their proxy.  Chief Complaint  Patient presents with  . Covid Exposure    Saw sister who was confirmed pos a week ago -HA -ABD Pain  -Sore throat     Subjective   Victor Morales is a 35 y.o. established patient. Telephone visit today for covid symptoms  HPI Onset 3-4 days ago Headaches, abdominal pain, and sore throat Symptoms steady. No major coughing. No fevers or chills Some aches Has taken some OTC meds with limited relief  No further concerns  Patient Active Problem List   Diagnosis Date Noted  . Grief 08/30/2018  . Adjustment disorder with anxious mood 08/30/2018    No past medical history on file.  Current Outpatient Medications  Medication Sig Dispense Refill  . busPIRone (BUSPAR) 7.5 MG tablet Take 1 tablet (7.5 mg total) by mouth 2 (two) times daily. (Patient not taking: No sig reported) 60 tablet 1   No current facility-administered medications for this visit.    No Known Allergies  Social History   Socioeconomic History  . Marital status: Single    Spouse name: Not on file  . Number of children: Not on file  . Years of education: Not  on file  . Highest education level: Not on file  Occupational History  . Not on file  Tobacco Use  . Smoking status: Never Smoker  . Smokeless tobacco: Never Used  Substance and Sexual Activity  . Alcohol use: Yes    Comment: weekends  . Drug use: No  . Sexual activity: Not on file  Other Topics Concern  . Not on file  Social History Narrative  . Not on file   Social Determinants of Health   Financial Resource Strain: Not on file  Food Insecurity: Not on file  Transportation Needs: Not on file  Physical Activity: Not on file  Stress: Not on file  Social Connections: Not on file  Intimate Partner Violence: Not on file    Review of Systems  Constitutional: Positive for malaise/fatigue.  HENT: Positive for sore throat.   Eyes: Negative.   Respiratory: Negative.   Cardiovascular: Negative.   Gastrointestinal: Positive for abdominal pain. Negative for blood in stool, constipation, diarrhea, heartburn, melena, nausea and vomiting.  Genitourinary: Negative.   Musculoskeletal: Negative.   Skin: Negative.   Neurological: Positive for headaches.  Endo/Heme/Allergies: Negative.   Psychiatric/Behavioral: Negative.   All other systems reviewed and are negative.   Objective   Vitals as reported by the patient: Today's Vitals   02/07/20 1612  Weight: 205 lb (93 kg)  Height: 6\' 3"  (1.905 m)    Victor Morales was seen today for covid exposure.  Diagnoses and all orders for this visit:  Sore throat -  Cancel: Novel Coronavirus, NAA ([facility]) -     Cancel: Influenza A/B -     COVID-19, Flu A+B and RSV   PLAN  Covid, flu, rsv testing  Suggest tylenol around the clock, hydration, and rest.  Will follow up as warranted  Er precautions reviewed  Patient encouraged to call clinic with any questions, comments, or concerns.   I discussed the assessment and treatment plan with the patient. The patient was provided an opportunity to ask questions and all were answered. The  patient agreed with the plan and demonstrated an understanding of the instructions.   The patient was advised to call back or seek an in-person evaluation if the symptoms worsen or if the condition fails to improve as anticipated.  I provided 15 minutes of non-face-to-face time during this encounter.  Janeece Agee, NP  Primary Care at [facility]

## 2020-02-08 LAB — COVID-19, FLU A+B AND RSV
Influenza A, NAA: NOT DETECTED
Influenza B, NAA: NOT DETECTED
RSV, NAA: NOT DETECTED
SARS-CoV-2, NAA: NOT DETECTED

## 2020-02-14 ENCOUNTER — Encounter: Payer: Self-pay | Admitting: Registered Nurse

## 2020-02-15 ENCOUNTER — Encounter: Payer: Self-pay | Admitting: Family Medicine

## 2020-02-15 ENCOUNTER — Telehealth (INDEPENDENT_AMBULATORY_CARE_PROVIDER_SITE_OTHER): Payer: BC Managed Care – PPO | Admitting: Family Medicine

## 2020-02-15 ENCOUNTER — Other Ambulatory Visit: Payer: Self-pay

## 2020-02-15 DIAGNOSIS — B349 Viral infection, unspecified: Secondary | ICD-10-CM

## 2020-02-15 DIAGNOSIS — R6889 Other general symptoms and signs: Secondary | ICD-10-CM

## 2020-02-15 NOTE — Progress Notes (Addendum)
Virtual Visit Note  I connected with patient on 02/15/20 at 0955 by telephone due to unable to work Epic video visit and verified that I am speaking with the correct person using two identifiers. Victor Morales is currently located at home and no family members are currently with them during visit. The provider, Azalee Course Zakariah Dejarnette, FNP is located in their office at time of visit.  I discussed the limitations, risks, security and privacy concerns of performing an evaluation and management service by telephone and the availability of in person appointments. I also discussed with the patient that there may be a patient responsible charge related to this service. The patient expressed understanding and agreed to proceed.   I provided 20 minutes of non-face-to-face time during this encounter.  Chief Complaint  Patient presents with  . Headache    Fatigue taking tylenol pm / dayquil   . Cough    Has been exposed at work was covid test on 1/5 was negative -has returned to work but still-Congested     HPI ? Sick for a week, lingering Feeling better today Extreme fatigue Went to work yesterday Sister had covid and coworkers Did a test at urgent care and it was negative 1/5 Symptoms started on 1/2 Works at a call center at [employer] Not vaccinated against covid  Still having fatigue, soreness Taking Advil, Tylenol PM, dayquil, alka seltzer, and sever cold and flu medication   No Known Allergies  Prior to Admission medications   Not on File    History reviewed. No pertinent past medical history.  History reviewed. No pertinent surgical history.  Social History   Tobacco Use  . Smoking status: Never Smoker  . Smokeless tobacco: Never Used  Substance Use Topics  . Alcohol use: Yes    Comment: weekends    Family History  Problem Relation Age of Onset  . Cancer Maternal Grandmother   . Diabetes Paternal Grandmother     Review of Systems  Constitutional: Positive for  malaise/fatigue. Negative for chills, fever and weight loss.  HENT: Negative for congestion, sinus pain and sore throat.   Respiratory: Positive for cough. Negative for sputum production and shortness of breath.   Cardiovascular: Negative for chest pain and palpitations.  Gastrointestinal: Negative for abdominal pain, constipation, diarrhea, nausea and vomiting.  Musculoskeletal: Positive for myalgias.  Neurological: Positive for headaches.    Objective  Constitutional:      General: Not in acute distress.    Appearance: Normal appearance. Not ill-appearing.   Pulmonary:     Effort: Pulmonary effort is normal. No respiratory distress.  Neurological:     Mental Status: Alert and oriented to person, place, and time.  Psychiatric:        Mood and Affect: Mood normal.        Behavior: Behavior normal.     ASSESSMENT and PLAN  Problem List Items Addressed This Visit   None   Visit Diagnoses    Flu-like symptoms    -  Primary   Viral illness       Relevant Orders   COVID-19, Flu A+B and RSV   [place], Qual W/Rflx if Negative     Work note sent RTC/ED precautions provided Discussed conservative/ OTC treatment of symptoms  Return if symptoms worsen or fail to improve.    The above assessment and management plan was discussed with the patient. The patient verbalized understanding of and has agreed to the management plan. Patient is aware to call  the clinic if symptoms persist or worsen. Patient is aware when to return to the clinic for a follow-up visit. Patient educated on when it is appropriate to go to the emergency department.     Victor Morales [name], FNP-BC Primary Care at [facility] [address] [place], [place] [address] Ph.  [phone] Fax [phone]  I have reviewed and agree with above documentation. Edwina Barth, MD

## 2020-02-15 NOTE — Patient Instructions (Addendum)
 COVID-19: What to Do if You Are Sick If you have a fever, cough or other symptoms, you might have COVID-19. Most people have mild illness and are able to recover at home. If you are sick:  Keep track of your symptoms.  If you have an emergency warning sign (including trouble breathing), call 911. Steps to help prevent the spread of COVID-19 if you are sick If you are sick with COVID-19 or think you might have COVID-19, follow the steps below to care for yourself and to help protect other people in your home and community. Stay home except to get medical care  Stay home. Most people with COVID-19 have mild illness and can recover at home without medical care. Do not leave your home, except to get medical care. Do not visit public areas.  Take care of yourself. Get rest and stay hydrated. Take over-the-counter medicines, such as acetaminophen, to help you feel better.  Stay in touch with your doctor. Call before you get medical care. Be sure to get care if you have trouble breathing, or have any other emergency warning signs, or if you think it is an emergency.  Avoid public transportation, ride-sharing, or taxis. Separate yourself from other people As much as possible, stay in a specific room and away from other people and pets in your home. If possible, you should use a separate bathroom. If you need to be around other people or animals in or outside of the home, wear a mask. Tell your close contactsthat they may have been exposed to COVID-19. An infected person can spread COVID-19 starting 48 hours (or 2 days) before the person has any symptoms or tests positive. By letting your close contacts know they may have been exposed to COVID-19, you are helping to protect everyone.  Additional guidance is available for those living in close quarters and shared housing.  See COVID-19 and Animals if you have questions about pets.  If you are diagnosed with COVID-19, someone from the health  department may call you. Answer the call to slow the spread. Monitor your symptoms  Symptoms of COVID-19 include fever, cough, or other symptoms.  Follow care instructions from your healthcare provider and local health department. Your local health authorities may give instructions on checking your symptoms and reporting information. When to seek emergency medical attention Look for emergency warning signs* for COVID-19. If someone is showing any of these signs, seek emergency medical care immediately:  Trouble breathing  Persistent pain or pressure in the chest  New confusion  Inability to wake or stay awake  Pale, gray, or blue-colored skin, lips, or nail beds, depending on skin tone *This list is not all possible symptoms. Please call your medical provider for any other symptoms that are severe or concerning to you. Call 911 or call ahead to your local emergency facility: Notify the operator that you are seeking care for someone who has or may have COVID-19. Call ahead before visiting your doctor  Call ahead. Many medical visits for routine care are being postponed or done by phone or telemedicine.  If you have a medical appointment that cannot be postponed, call your doctor's office, and tell them you have or may have COVID-19. This will help the office protect themselves and other patients. Get  tested  If you have symptoms of COVID-19, get tested. While waiting for test results, you stay away from others, including staying apart from those living in your household.  You can visit your state,   tribal, local, and territorialhealth department's website to look for the latest local information on testing sites. If you are sick, wear a mask over your nose and mouth  You should wear a mask over your nose and mouth if you must be around other people or animals, including pets (even at home).  You don't need to wear the mask if you are alone. If you can't put on a mask (because of  trouble breathing, for example), cover your coughs and sneezes in some other way. Try to stay at least 6 feet away from other people. This will help protect the people around you.  Masks should not be placed on young children under age 2 years, anyone who has trouble breathing, or anyone who is not able to remove the mask without help. Note: During the COVID-19 pandemic, medical grade facemasks are reserved for healthcare workers and some first responders. Cover your coughs and sneezes  Cover your mouth and nose with a tissue when you cough or sneeze.  Throw away used tissues in a lined trash can.  Immediately wash your hands with soap and water for at least 20 seconds. If soap and water are not available, clean your hands with an alcohol-based hand sanitizer that contains at least 60% alcohol. Clean your hands often  Wash your hands often with soap and water for at least 20 seconds. This is especially important after blowing your nose, coughing, or sneezing; going to the bathroom; and before eating or preparing food.  Use hand sanitizer if soap and water are not available. Use an alcohol-based hand sanitizer with at least 60% alcohol, covering all surfaces of your hands and rubbing them together until they feel dry.  Soap and water are the best option, especially if hands are visibly dirty.  Avoid touching your eyes, nose, and mouth with unwashed hands.  Handwashing Tips Avoid sharing personal household items  Do not share dishes, drinking glasses, cups, eating utensils, towels, or bedding with other people in your home.  Wash these items thoroughly after using them with soap and water or put in the dishwasher. Clean all "high-touch" surfaces everyday  Clean and disinfect high-touch surfaces in your "sick room" and bathroom; wear disposable gloves. Let someone else clean and disinfect surfaces in common areas, but you should clean your bedroom and bathroom, if possible.  If a  caregiver or other person needs to clean and disinfect a sick person's bedroom or bathroom, they should do so on an as-needed basis. The caregiver/other person should wear a mask and disposable gloves prior to cleaning. They should wait as long as possible after the person who is sick has used the bathroom before coming in to clean and use the bathroom. ? High-touch surfaces include phones, remote controls, counters, tabletops, doorknobs, bathroom fixtures, toilets, keyboards, tablets, and bedside tables.  Clean and disinfect areas that may have blood, stool, or body fluids on them.  Use household cleaners and disinfectants. Clean the area or item with soap and water or another detergent if it is dirty. Then, use a household disinfectant. ? Be sure to follow the instructions on the label to ensure safe and effective use of the product. Many products recommend keeping the surface wet for several minutes to ensure germs are killed. Many also recommend precautions such as wearing gloves and making sure you have good ventilation during use of the product. ? Use a product from EPA's List N: Disinfectants for Coronavirus (COVID-19). ? Complete Disinfection Guidance When you can   be around others after being sick with COVID-19 Deciding when you can be around others is different for different situations. Find out when you can safely end home isolation. For any additional questions about your care, contact your healthcare provider or state or local health department. 04/19/2019 Content source: National Center for Immunization and Respiratory Diseases (NCIRD), Division of Viral Diseases This information is not intended to replace advice given to you by your health care provider. Make sure you discuss any questions you have with your health care provider. Document Revised: 12/04/2019 Document Reviewed: 12/04/2019 Elsevier Patient Education  2021 Elsevier Inc.   If you have lab work done today you will be  contacted with your lab results within the next 2 weeks.  If you have not heard from us then please contact us. The fastest way to get your results is to register for My Chart.   IF you received an x-ray today, you will receive an invoice from [facility]. Please contact [facility] at [phone] with questions or concerns regarding your invoice.   IF you received labwork today, you will receive an invoice from [facility]. Please contact [facility] at [phone] with questions or concerns regarding your invoice.   Our billing staff will not be able to assist you with questions regarding bills from these companies.  You will be contacted with the lab results as soon as they are available. The fastest way to get your results is to activate your My Chart account. Instructions are located on the last page of this paperwork. If you have not heard from us regarding the results in 2 weeks, please contact this office.      

## 2020-02-16 ENCOUNTER — Ambulatory Visit: Payer: BC Managed Care – PPO

## 2020-02-16 DIAGNOSIS — B349 Viral infection, unspecified: Secondary | ICD-10-CM | POA: Diagnosis not present

## 2020-02-16 NOTE — Addendum Note (Signed)
Addended by: Imogene Burn A on: 02/16/2020 10:08 AM   Modules accepted: Orders

## 2020-02-18 LAB — COVID-19, FLU A+B AND RSV
Influenza A, NAA: NOT DETECTED
Influenza B, NAA: NOT DETECTED
RSV, NAA: NOT DETECTED
SARS-CoV-2, NAA: NOT DETECTED

## 2020-07-02 ENCOUNTER — Ambulatory Visit: Payer: BC Managed Care – PPO | Attending: Critical Care Medicine

## 2020-07-02 DIAGNOSIS — Z20822 Contact with and (suspected) exposure to covid-19: Secondary | ICD-10-CM | POA: Diagnosis not present

## 2020-07-03 LAB — SARS-COV-2, NAA 2 DAY TAT

## 2020-07-03 LAB — NOVEL CORONAVIRUS, NAA: SARS-CoV-2, NAA: NOT DETECTED

## 2020-08-21 ENCOUNTER — Other Ambulatory Visit: Payer: Self-pay

## 2020-08-21 ENCOUNTER — Encounter (HOSPITAL_BASED_OUTPATIENT_CLINIC_OR_DEPARTMENT_OTHER): Payer: Self-pay

## 2020-08-21 ENCOUNTER — Emergency Department (HOSPITAL_BASED_OUTPATIENT_CLINIC_OR_DEPARTMENT_OTHER)
Admission: EM | Admit: 2020-08-21 | Discharge: 2020-08-21 | Disposition: A | Discharge status: Home / Self Care | Payer: BC Managed Care – PPO | Attending: Emergency Medicine | Admitting: Emergency Medicine

## 2020-08-21 DIAGNOSIS — Y92410 Unspecified street and highway as the place of occurrence of the external cause: Secondary | ICD-10-CM | POA: Insufficient documentation

## 2020-08-21 DIAGNOSIS — R519 Headache, unspecified: Secondary | ICD-10-CM | POA: Insufficient documentation
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
## 2020-08-21 NOTE — Discharge Instructions (Addendum)
Motrin and Tylenol as needed as directed. Follow up with sports medicine clinic if not improving in 3-5 days.
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
## 2021-05-31 ENCOUNTER — Other Ambulatory Visit: Payer: Self-pay

## 2021-05-31 ENCOUNTER — Emergency Department (HOSPITAL_BASED_OUTPATIENT_CLINIC_OR_DEPARTMENT_OTHER)
Admission: EM | Admit: 2021-05-31 | Discharge: 2021-05-31 | Disposition: A | Discharge status: Home / Self Care | Payer: BC Managed Care – PPO | Attending: Emergency Medicine | Admitting: Emergency Medicine

## 2021-05-31 ENCOUNTER — Encounter (HOSPITAL_BASED_OUTPATIENT_CLINIC_OR_DEPARTMENT_OTHER): Payer: Self-pay | Admitting: Emergency Medicine

## 2021-05-31 DIAGNOSIS — S01511A Laceration without foreign body of lip, initial encounter: Secondary | ICD-10-CM | POA: Insufficient documentation

## 2021-05-31 DIAGNOSIS — S0993XA Unspecified injury of face, initial encounter: Secondary | ICD-10-CM | POA: Diagnosis not present

## 2021-05-31 DIAGNOSIS — S01551A Open bite of lip, initial encounter: Secondary | ICD-10-CM | POA: Diagnosis not present

## 2021-05-31 DIAGNOSIS — S0185XA Open bite of other part of head, initial encounter: Secondary | ICD-10-CM

## 2021-05-31 DIAGNOSIS — Z23 Encounter for immunization: Secondary | ICD-10-CM | POA: Diagnosis not present

## 2021-05-31 DIAGNOSIS — W540XXA Bitten by dog, initial encounter: Secondary | ICD-10-CM | POA: Diagnosis not present

## 2021-05-31 MED ORDER — AMOXICILLIN-POT CLAVULANATE 875-125 MG PO TABS: 1.0000 | ORAL_TABLET | Freq: Two times a day (BID) | ORAL | 0 refills | Status: AC

## 2021-05-31 MED ORDER — LIDOCAINE HCL 2 % IJ SOLN
20.0000 mL | Freq: Once | INTRAMUSCULAR | Status: AC
  Administered 2021-05-31: 400 mg via INTRADERMAL
  Filled 2021-05-31: qty 20

## 2021-05-31 MED ORDER — AMOXICILLIN-POT CLAVULANATE 875-125 MG PO TABS
1.0000 | ORAL_TABLET | Freq: Once | ORAL | Status: AC
  Administered 2021-05-31: 1 via ORAL
  Filled 2021-05-31: qty 1

## 2021-05-31 MED ORDER — TETANUS-DIPHTH-ACELL PERTUSSIS 5-2.5-18.5 LF-MCG/0.5 IM SUSY
0.5000 mL | PREFILLED_SYRINGE | Freq: Once | INTRAMUSCULAR | Status: AC
  Administered 2021-05-31: 0.5 mL via INTRAMUSCULAR
  Filled 2021-05-31: qty 0.5

## 2021-05-31 NOTE — ED Triage Notes (Signed)
Dog bite to lower lip.  Pt's own dog.  He believes he needs stitches.  Pt has laceration to lower lip, full thickness.  Bleeding controlled. ?

## 2021-05-31 NOTE — Discharge Instructions (Signed)
Please read and follow all provided instructions. ? ?Your diagnoses today include:  ?1. Dog bite of face, initial encounter   ?2. Lip laceration, initial encounter   ? ? ?Tests performed today include: ?Vital signs. See below for your results today.  ? ?Medications prescribed:  ?Augmentin - antibiotic ? ?You have been prescribed an antibiotic medicine: take the entire course of medicine even if you are feeling better. Stopping early can cause the antibiotic not to work. ? ?Take any prescribed medications only as directed.  ? ?Home care instructions:  ?Follow any educational materials and wound care instructions contained in this packet.  ? ?Keep affected area above the level of your heart when possible to minimize swelling. Wash area gently twice a day with warm soapy water. Do not apply alcohol or hydrogen peroxide. Cover the area if it draining or weeping.  ? ?Follow-up instructions: ?Suture Removal: Return to the Emergency Department or see your primary care care doctor in 5 days for a recheck of your wound and removal of your sutures or staples.   ? ?Six non-absorbable sutures were placed in your lower lip and one nonabsorbable suture was placed in the left upper lip.  You will need to have 7 sutures removed.  The other 5 sutures can be gently removed in a few days as a weekend. ? ?Return instructions:  ?Return to the Emergency Department if you have: ?Fever ?Worsening pain ?Worsening swelling of the wound ?Pus draining from the wound ?Redness of the skin that moves away from the wound, especially if it streaks away from the affected area  ?Any other emergent concerns ? ?Your vital signs today were: ?BP 133/89 (BP Location: Right Arm)   Pulse 74   Temp 97.7 ?F (36.5 ?C) (Oral)   Resp 18   Ht 6\' 2"  (1.88 m)   Wt 95.3 kg   SpO2 100%   BMI 26.96 kg/m?  ?If your blood pressure (BP) was elevated above 135/85 this visit, please have this repeated by your doctor within one month. ?-------------- ? ?

## 2021-05-31 NOTE — ED Provider Notes (Signed)
?[facility] EMERGENCY DEPARTMENT ?Provider Note ? ? ?CSN: [id number] ?Arrival date & time: 05/31/21  1246 ? ?  ? ?History ? ?Chief Complaint  ?Patient presents with  ? Animal Bite  ? ? ?Victor Morales is a 36 y.o. male. ? ?Patient presents the emergency department for evaluation of lip laceration to the lower lip sustained just prior to arrival.  Patient was bitten by his own [place] mix who is up-to-date on vaccines.  Patient rinsed the mouth and wound with mouthwash prior to arrival.  Unknown last tetanus.  No other injuries reported.  No dental injuries. ? ? ?  ? ?Home Medications ?Prior to Admission medications   ?Not on File  ?   ? ?Allergies    ?Patient has no known allergies.   ? ?Review of Systems   ?Review of Systems ? ?Physical Exam ?Updated Vital Signs ?BP 122/84 (BP Location: Left Arm)   Pulse 65   Temp 97.7 ?F (36.5 ?C) (Oral)   Resp 17   Ht 6\' 2"  (1.88 m)   Wt 95.3 kg   SpO2 100%   BMI 26.96 kg/m?  ? ?Physical Exam ?Vitals and nursing note reviewed.  ?Constitutional:   ?   Appearance: He is well-developed.  ?HENT:  ?   Head: Normocephalic.  ?   Comments: There is a 2 cm linear laceration, directly over the midline of the lip, extending from the vermilion border superiorly to just inside the mouth involving the inner oral mucosa and inner surface of the lip.  Wound is widely gaping.  It is clean, hemostatic.  No foreign bodies visualized. ? ?Two 5 mm lacerations to the inner lower lip midline and left inner lower lip.  Minimally gaping, hemostatic, clean. ? ?There is a 3 mm laceration to the left upper lip overlying the vermilion border.  Wound is shallow, minimally gaping. ? ?Patient with 8 mm laceration to the external left lower lip, shallow-based, clean base, hemostatic. ?Eyes:  ?   Conjunctiva/sclera: Conjunctivae normal.  ?Pulmonary:  ?   Effort: No respiratory distress.  ?Musculoskeletal:  ?   Cervical back: Normal range of motion and neck supple.  ?Skin: ?   General:  Skin is warm and dry.  ?Neurological:  ?   Mental Status: He is alert.  ? ? ? ? ? ? ? ? ? ? ? ?ED Results / Procedures / Treatments   ?Labs ?(all labs ordered are listed, but only abnormal results are displayed) ?Labs Reviewed - No data to display ? ?EKG ?None ? ?Radiology ?No results found. ? ?Procedures ? .Laceration Repair ? ?Date/Time: 05/31/2021 4:28 PM ?Performed by: 06/02/2021, PA-C ?Authorized by: Renne Crigler, PA-C  ? ?Consent:  ?  Consent obtained:  Verbal ?  Consent given by:  Patient ?  Risks discussed:  Infection, pain, retained foreign body, poor cosmetic result, poor wound healing, need for additional repair and nerve damage ?  Alternatives discussed:  No treatment ?Universal protocol:  ?  Patient identity confirmed:  Verbally with patient and provided demographic data ?Anesthesia:  ?  Anesthesia method:  Nerve block ?  Block location:  Bilateral mental block ?  Block needle gauge:  27 G ?  Block anesthetic:  Lidocaine 2% w/o epi ?  Block technique:  2 cc lidocaine injected towards the mental foramen, repeated bilaterally ?  Block injection procedure:  Anatomic landmarks identified, introduced needle, incremental injection, anatomic landmarks palpated and negative aspiration for blood ?  Block outcome:  Anesthesia achieved ?  Laceration details:  ?  Location:  Lip ?  Lip location:  Lower exterior lip ?  Wound length (cm): 2 cm midline lip laceration, 5 mm inner lip laceration, 5 mm inner lip laceration, 3 mm vermilion border laceration, 8 mm external left lower lip laceration. ?Pre-procedure details:  ?  Preparation:  Patient was prepped and draped in usual sterile fashion ?Treatment:  ?  Area cleansed with:  Saline ?  Amount of cleaning:  Extensive ?  Irrigation solution:  Sterile saline ?  Irrigation volume:  500cc ?  Irrigation method:  Pressure wash (bottle cap) ?  Visualized foreign bodies/material removed: no   ?Skin repair:  ?  Repair method:  Sutures ?  Suture size:  5-0 ?  Suture material:   Nylon (vicryl rapide) ?  Suture technique:  Simple interrupted ?  Number of sutures: 12 sutures total, see below for distribution. ?Approximation:  ?  Vermilion border well-aligned: yes (Well aligned left upper vermillion border)   ?Repair type:  ?  Repair type:  Intermediate ?Post-procedure details:  ?  Dressing:  Open (no dressing) ?  Procedure completion:  Tolerated well, no immediate complications ?Comments:  ?   Midline lower lip laceration: 6 5-0 nylon, 1 5-0 Vicryl Rapide ?Inner midline lower lip laceration: One 5-0 Vicryl Rapide ?Left inner lower lip laceration: One 5-0 Vicryl Rapide ?Left upper lip vermilion border laceration: One 5-0 nylon ?Left outer lower lip laceration: Two 5-0 Vicryl repeat  ? ? ?Medications Ordered in ED ?Medications  ?lidocaine (XYLOCAINE) 2 % (with pres) injection 400 mg (has no administration in time range)  ?Tdap (BOOSTRIX) injection 0.5 mL (0.5 mLs Intramuscular Given 05/31/21 1332)  ? ? ?ED Course/ Medical Decision Making/ A&P ?  ? ?Patient seen and examined. History obtained directly from patient.  Tetanus updated.  Dog up-to-date on rabies. ? ?Labs/EKG: None ordered ? ?Imaging: None ordered ? ?Medications/Fluids: Lidocaine 2% without epinephrine ? ?Most recent vital signs reviewed and are as follows: ?BP 132/85 (BP Location: Right Arm)   Pulse 82   Temp 97.7 ?F (36.5 ?C) (Oral)   Resp 18   Ht 6\' 2"  (1.88 m)   Wt 95.3 kg   SpO2 99%   BMI 26.96 kg/m?  ? ?Initial impression: Several complex lip lacerations due to dog bite ? ?Mental block was placed with good results bilaterally.  A small amount of additional lidocaine injected locally after initiation of repair. ? ?Patient tolerated this well per procedure note.  First dose of Augmentin given in ED. ? ?4:33 PM Patient counseled on wound care. Patient counseled on need to return or see PCP/urgent care for suture removal in 5 days. Patient was urged to return to the Emergency Department urgently with worsening pain,  swelling, expanding erythema especially if it streaks away from the affected area, fever, or if they have any other concerns. Patient verbalized understanding.  ? ?                        ?Medical Decision Making ?Risk ?Prescription drug management. ? ? ?Lip laceration secondary to dog bite, closed at bedside without complication.  Patient tolerated well.  No suspected damage to underlying structures.  Discussed that he may have some minor sensory deficits potentially given wounds.  Vermilion border well aligned. ? ?The patient's vital signs, pertinent lab work and imaging were reviewed and interpreted as discussed in the ED course. Hospitalization was considered for further testing, treatments, or serial exams/observation. However  as patient is well-appearing, has a stable exam, and reassuring studies today, I do not feel that they warrant admission at this time. This plan was discussed with the patient who verbalizes agreement and comfort with this plan and seems reliable and able to return to the Emergency Department with worsening or changing symptoms.  ? ? ? ? ? ? ? ? ?Final Clinical Impression(s) / ED Diagnoses ?Final diagnoses:  ?Dog bite of face, initial encounter  ?Lip laceration, initial encounter  ? ? ?Rx / DC Orders ?ED Discharge Orders   ? ?      Ordered  ?  amoxicillin-clavulanate (AUGMENTIN) 875-125 MG tablet  Every 12 hours       ? 05/31/21 1620  ? ?  ?  ? ?  ? ? ?  ?Renne Crigler, PA-C ?05/31/21 1634 ? ?  ?Milagros Loll, MD ?06/01/21 1441 ? ?

## 2021-05-31 NOTE — ED Notes (Signed)
Provider at bedside

## 2021-06-05 ENCOUNTER — Emergency Department (HOSPITAL_BASED_OUTPATIENT_CLINIC_OR_DEPARTMENT_OTHER)
Admission: EM | Admit: 2021-06-05 | Discharge: 2021-06-05 | Disposition: A | Discharge status: Home / Self Care | Payer: BC Managed Care – PPO | Attending: Emergency Medicine | Admitting: Emergency Medicine

## 2021-06-05 ENCOUNTER — Encounter (HOSPITAL_BASED_OUTPATIENT_CLINIC_OR_DEPARTMENT_OTHER): Payer: Self-pay

## 2021-06-05 ENCOUNTER — Other Ambulatory Visit: Payer: Self-pay

## 2021-06-05 DIAGNOSIS — W540XXD Bitten by dog, subsequent encounter: Secondary | ICD-10-CM | POA: Insufficient documentation

## 2021-06-05 DIAGNOSIS — Z4802 Encounter for removal of sutures: Secondary | ICD-10-CM | POA: Diagnosis not present

## 2021-06-05 DIAGNOSIS — S01511D Laceration without foreign body of lip, subsequent encounter: Secondary | ICD-10-CM | POA: Insufficient documentation

## 2021-06-05 NOTE — ED Triage Notes (Signed)
Patient here to have lip sutures removed - sutures placed on Saturday.  ?

## 2021-06-05 NOTE — Discharge Instructions (Signed)
Read the information about removal of stitches attached to these papers.  Your work note is attached.  Return with any fevers, chills or worsening concerns ?

## 2021-06-05 NOTE — ED Notes (Signed)
Pt here to have sutures removed in lower lip after being ibtten by his dog are well healed with edges well approxamated no s/s of infection ?

## 2021-06-05 NOTE — ED Provider Notes (Signed)
?[facility] ?Provider Note ? ? ?CSN: [id number] ?Arrival date & time: 06/05/21  1344 ? ?  ? ?History ? ?Chief Complaint  ?Patient presents with  ? Suture / Staple Removal  ? ? ?Victor Morales is a 36 y.o. male for suture removal.  Patient was bit by his dog on 4/29 and had stitches put in his lip.  He was told to return for the removal.  Denies any complication and has been keeping antibiotic ointment and coconut oil on the area. ? ? ?Suture / Staple Removal ? ? ?  ? ?Home Medications ?Prior to Admission medications   ?Medication Sig Start Date End Date Taking? Authorizing Provider  ?amoxicillin-clavulanate (AUGMENTIN) 875-125 MG tablet Take 1 tablet by mouth every 12 (twelve) hours. 05/31/21   Carlisle Cater, PA-C  ?   ? ?Allergies    ?Patient has no known allergies.   ? ?Review of Systems   ?Review of Systems ? ?Physical Exam ?Updated Vital Signs ?BP 119/72 (BP Location: Left Arm)   Pulse 95   Temp 99.8 ?F (37.7 ?C)   Resp 18   Ht 6\' 3"  (1.905 m)   Wt 95.3 kg   SpO2 97%   BMI 26.25 kg/m?  ?Physical Exam ?Vitals and nursing note reviewed.  ?Constitutional:   ?   Appearance: Normal appearance.  ?HENT:  ?   Head: Normocephalic and atraumatic.  ?   Mouth/Throat:  ?   Comments: 6 vertical stitches in the middle of patient's lower lip.  1 gut suture above chest on the inside of the lip.  2 sutures placed in the left upper lip and 1 in the left lower. ?Eyes:  ?   General: No scleral icterus. ?   Conjunctiva/sclera: Conjunctivae normal.  ?Pulmonary:  ?   Effort: Pulmonary effort is normal. No respiratory distress.  ?Skin: ?   Findings: No rash.  ?Neurological:  ?   Mental Status: He is alert.  ?Psychiatric:     ?   Mood and Affect: Mood normal.  ? ? ?ED Results / Procedures / Treatments   ?Labs ?(all labs ordered are listed, but only abnormal results are displayed) ?Labs Reviewed - No data to display ? ?EKG ?None ? ?Radiology ?No results found. ? ?Procedures ?Marland KitchenSuture  Removal ? ?Date/Time: 06/05/2021 2:36 PM ?Performed by: Rhae Hammock, PA-C ?Authorized by: Rhae Hammock, PA-C  ? ?Consent:  ?  Consent obtained:  Verbal ?  Consent given by:  Patient ?  Risks discussed:  Pain ?Universal protocol:  ?  Patient identity confirmed:  Verbally with patient ?Location:  ?  Location:  Mouth ?  Mouth location:  Upper exterior lip ?Procedure details:  ?  Wound appearance:  No signs of infection ?  Number of sutures removed:  10 ?Post-procedure details:  ?  Procedure completion:  Tolerated  ? ? ?Medications Ordered in ED ?Medications - No data to display ? ?ED Course/ Medical Decision Making/ A&P ?  ?                        ?Medical Decision Making ?Suture Repair note is as follows: ? Midline lower lip laceration: 6 5-0 nylon, 1 5-0 Vicryl Rapide ?Inner midline lower lip laceration: One 5-0 Vicryl Rapide ?Left inner lower lip laceration: One 5-0 Vicryl Rapide ?Left upper lip vermilion border laceration: One 5-0 nylon ?Left outer lower lip laceration: Two 5-0 Vicryl repeat  ? ?All nonabsorbable sutures were removed.  I also removed and offered midline Vicryl rapide that was nearly out.  Patient tolerated well, discharged with at home care  ? ? ?Final Clinical Impression(s) / ED Diagnoses ?Final diagnoses:  ?Visit for suture removal  ? ? ?Rx / DC Orders ? ?Results and diagnoses were explained to the patient. Return precautions discussed in full. Patient had no additional questions and expressed complete understanding. ? ? ?This chart was dictated using voice recognition software.  Despite best efforts to proofread,  errors can occur which can change the documentation meaning.  ?Results and diagnoses were explained to the patient. Return precautions discussed in full. Patient had no additional questions and expressed complete understanding. ? ? ?This chart was dictated using voice recognition software.  Despite best efforts to proofread,  errors can occur which can change the  documentation meaning.  ?  ?Rhae Hammock, PA-C ?06/05/21 1437 ? ?  ?Fredia Sorrow, MD ?06/09/21 [phone] ? ?

## 2021-08-05 DIAGNOSIS — J029 Acute pharyngitis, unspecified: Secondary | ICD-10-CM | POA: Diagnosis not present

## 2021-08-05 DIAGNOSIS — R5383 Other fatigue: Secondary | ICD-10-CM | POA: Diagnosis not present

## 2021-08-05 DIAGNOSIS — Z20822 Contact with and (suspected) exposure to covid-19: Secondary | ICD-10-CM | POA: Diagnosis not present

## 2022-04-08 DIAGNOSIS — Z8349 Family history of other endocrine, nutritional and metabolic diseases: Secondary | ICD-10-CM | POA: Diagnosis not present

## 2022-04-08 DIAGNOSIS — R5383 Other fatigue: Secondary | ICD-10-CM | POA: Diagnosis not present

## 2022-04-08 DIAGNOSIS — L659 Nonscarring hair loss, unspecified: Secondary | ICD-10-CM | POA: Diagnosis not present

## 2022-04-24 DIAGNOSIS — R5383 Other fatigue: Secondary | ICD-10-CM | POA: Diagnosis not present

## 2022-04-24 DIAGNOSIS — L659 Nonscarring hair loss, unspecified: Secondary | ICD-10-CM | POA: Diagnosis not present

## 2022-04-24 DIAGNOSIS — R4789 Other speech disturbances: Secondary | ICD-10-CM | POA: Diagnosis not present

## 2022-04-24 DIAGNOSIS — E559 Vitamin D deficiency, unspecified: Secondary | ICD-10-CM | POA: Diagnosis not present

## 2022-04-24 DIAGNOSIS — Z13220 Encounter for screening for lipoid disorders: Secondary | ICD-10-CM | POA: Diagnosis not present

## 2022-04-24 DIAGNOSIS — Z0000 Encounter for general adult medical examination without abnormal findings: Secondary | ICD-10-CM | POA: Diagnosis not present

## 2022-04-24 DIAGNOSIS — Z113 Encounter for screening for infections with a predominantly sexual mode of transmission: Secondary | ICD-10-CM | POA: Diagnosis not present

## 2022-04-24 DIAGNOSIS — R413 Other amnesia: Secondary | ICD-10-CM | POA: Diagnosis not present
